# Patient Record
Sex: Male | Born: 1950 | Race: White | Hispanic: No | Marital: Single | Smoking: Never smoker
Health system: Southern US, Community
[De-identification: ages and names within clinical notes are randomized; demographics above are authoritative.]

## PROBLEM LIST (undated history)

## (undated) HISTORY — PX: APPENDECTOMY: SHX54

## (undated) HISTORY — PX: KNEE SURGERY: SHX244

---

## 2008-10-20 ENCOUNTER — Emergency Department: Payer: Self-pay | Admitting: Emergency Medicine

## 2012-04-29 ENCOUNTER — Emergency Department: Payer: Self-pay | Admitting: Emergency Medicine

## 2012-04-29 LAB — URINALYSIS, COMPLETE
Bilirubin,UR: NEGATIVE
Glucose,UR: NEGATIVE mg/dL (ref 0–75)
Nitrite: NEGATIVE
Ph: 5 (ref 4.5–8.0)
Protein: NEGATIVE
Squamous Epithelial: NONE SEEN
WBC UR: 8 /HPF (ref 0–5)

## 2012-04-29 LAB — COMPREHENSIVE METABOLIC PANEL
Albumin: 4.1 g/dL (ref 3.4–5.0)
Alkaline Phosphatase: 65 U/L (ref 50–136)
Anion Gap: 9 (ref 7–16)
Bilirubin,Total: 0.3 mg/dL (ref 0.2–1.0)
Chloride: 104 mmol/L (ref 98–107)
Creatinine: 1.31 mg/dL — ABNORMAL HIGH (ref 0.60–1.30)
Osmolality: 282 (ref 275–301)
Potassium: 4.2 mmol/L (ref 3.5–5.1)
SGOT(AST): 17 U/L (ref 15–37)
Sodium: 139 mmol/L (ref 136–145)

## 2012-04-29 LAB — CBC WITH DIFFERENTIAL/PLATELET
Basophil %: 0.2 %
Eosinophil #: 0 10*3/uL (ref 0.0–0.7)
Eosinophil %: 0 %
HCT: 39.7 % — ABNORMAL LOW (ref 40.0–52.0)
Lymphocyte %: 3.7 %
MCH: 28.6 pg (ref 26.0–34.0)
Monocyte #: 0.6 x10 3/mm (ref 0.2–1.0)
Monocyte %: 3.2 %
Neutrophil #: 17.2 10*3/uL — ABNORMAL HIGH (ref 1.4–6.5)
Neutrophil %: 92.9 %
RBC: 4.57 10*6/uL (ref 4.40–5.90)
WBC: 18.6 10*3/uL — ABNORMAL HIGH (ref 3.8–10.6)

## 2013-09-23 IMAGING — CT CT STONE STUDY
2 series · 12 of 16 positions shown, 15 images · non-contrast
Comparison: none

REASON FOR EXAM: right sided flank pain
COMMENTS:

PROCEDURE:     CT  - CT ABDOMEN /PELVIS WO (STONE)  - April 29, 2012 [DATE]
RESULT:     CT abdomen pelvis dated 04/29/2012.
TECHNIQUE: Helical noncontrasted 3 mm sections were obtained from the lung
bases through the pubic symphysis.

[Series 2: soft tissue · axial · 0.67mm/px · z∈[-938,-556]mm · 10 of 157 slices shown, 13 images]
[im 15/157  soft-tissue]
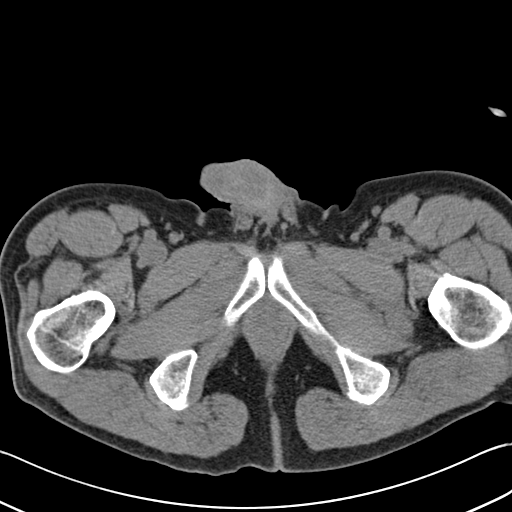
[im 15/157  bone]
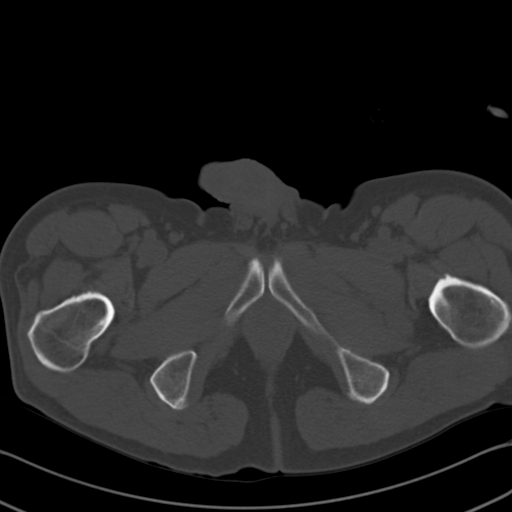
[im 29/157  soft-tissue]
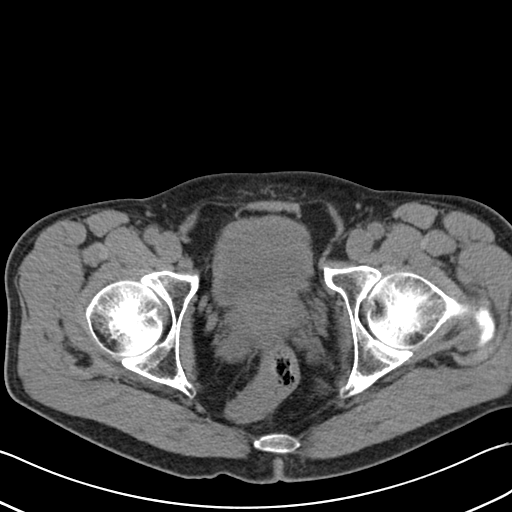
[im 43/157  soft-tissue]
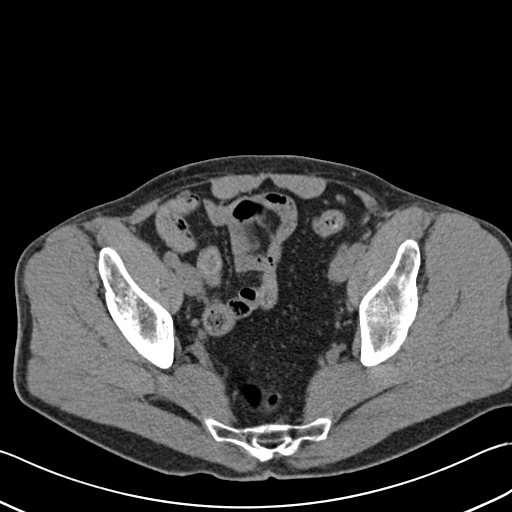
[im 57/157  soft-tissue]
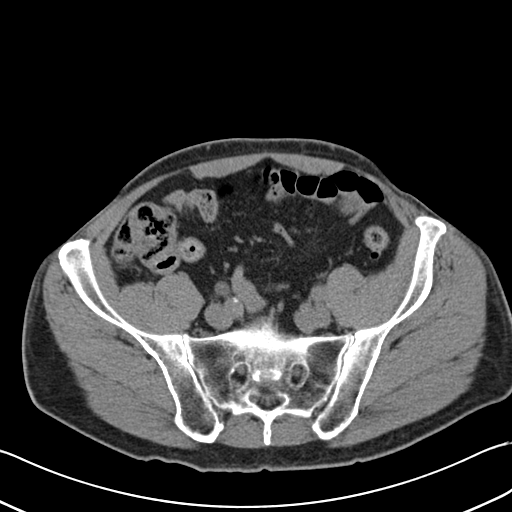
[im 71/157  soft-tissue]
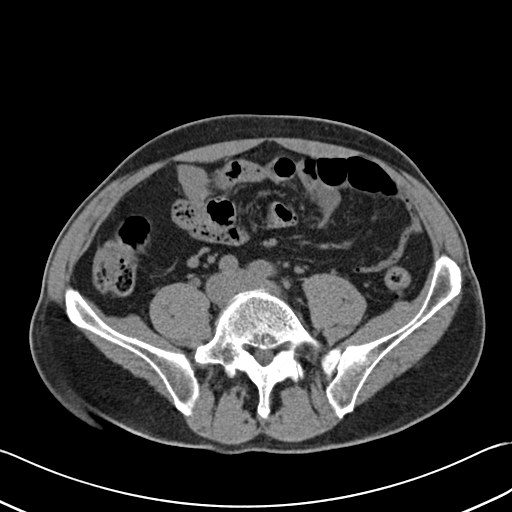
[im 71/157  bone]
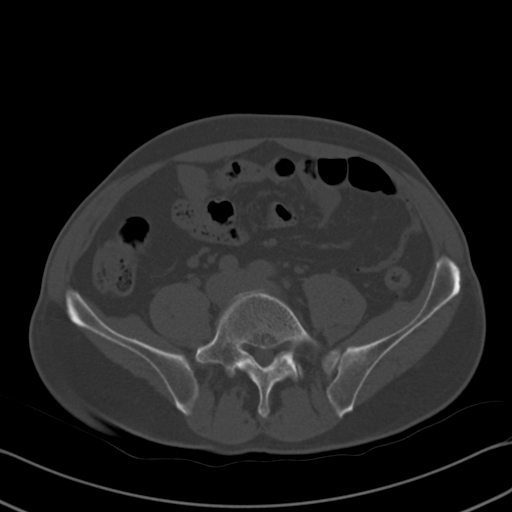
[im 86/157  soft-tissue]
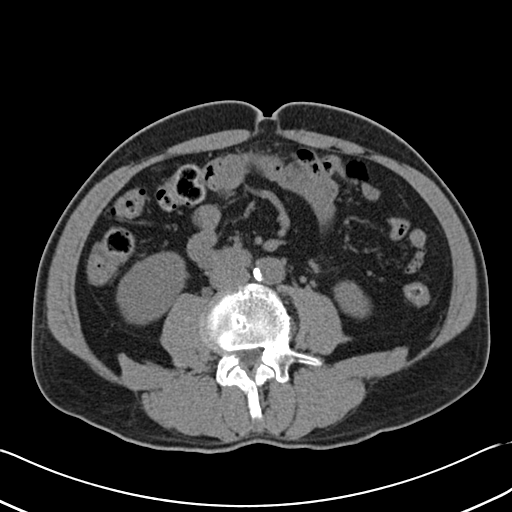
[im 100/157  soft-tissue]
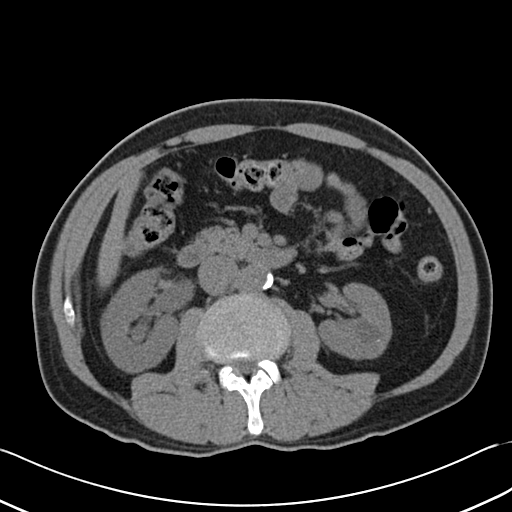
[im 114/157  soft-tissue]
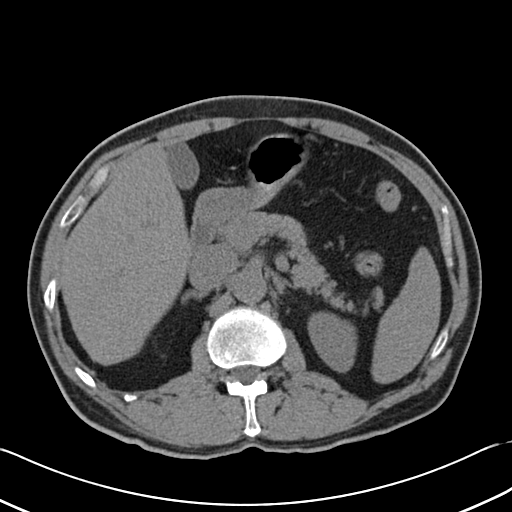
[im 128/157  soft-tissue]
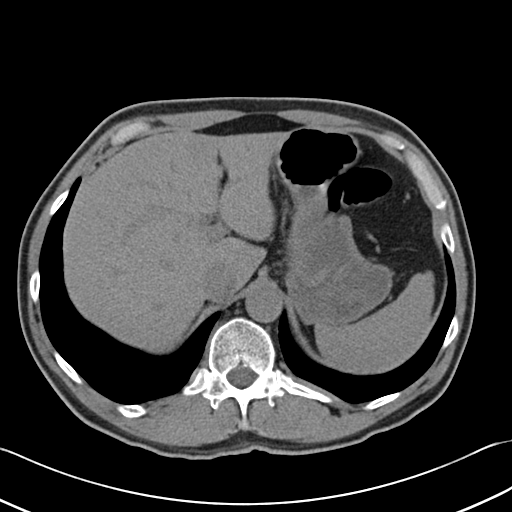
[im 128/157  bone]
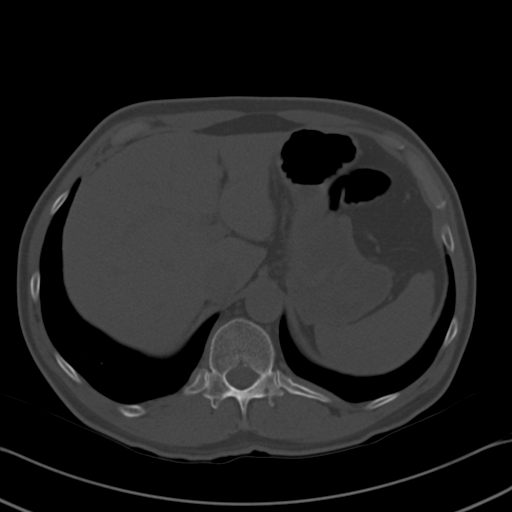
[im 142/157  soft-tissue]
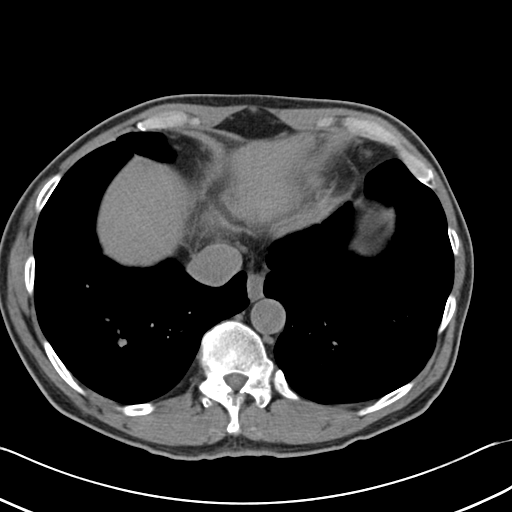

[Series 4: lung · axial · 0.67mm/px · z∈[-610,-562]mm · 2 of 50 slices shown]
[im 17/50  bone]
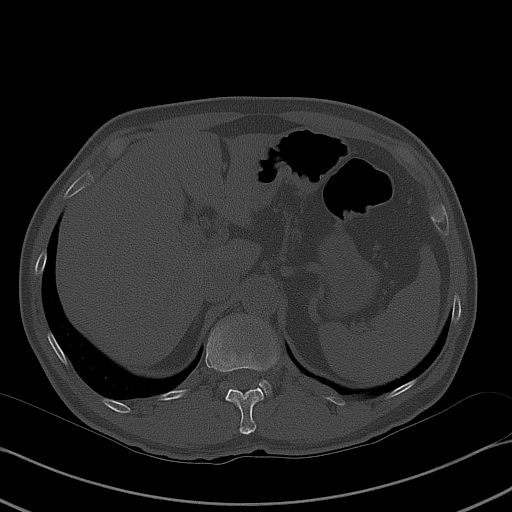
[im 33/50  bone]
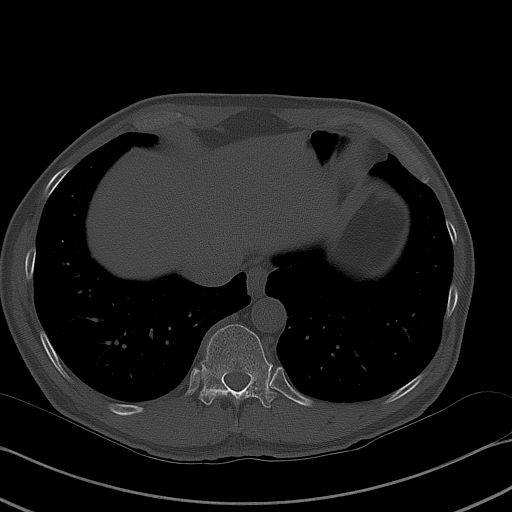

[12 of 16 positions shown; findings below may reference images not displayed]

FINDINGS: Linear area of increased density projects within the medial
segment right middle lobe. A mild area of increased density projects on the
posterior aspect of the right cardiophrenic angle.

Evaluation of the right kidney demonstrates mild hydronephrosis as well as
mild to moderate hydroureter. The ureter is dilated to level the urinary
bladder. A 3.5 mm ureterovesical calculus is identified.

The left kidney demonstrates a vague area of low attenuation within the
posterior mid pole like representing a cyst.

Noncontrast evaluation of the liver, spleen, adrenals, pancreas is
unremarkable. There is no evidence of bowel obstruction, enteritis, colitis,
diverticulitis, nor appendicitis. There is diverticulosis within the sigmoid
colon without diverticulitis.
IMPRESSION: 3.5 mm ureterovesical calculus on the right with associated
mild obstructive uropathy.
2. Atelectasis versus infiltrate within the right middle lobe. These
findings are mild.

## 2018-02-10 ENCOUNTER — Emergency Department
Admission: EM | Admit: 2018-02-10 | Discharge: 2018-02-10 | Disposition: A | Discharge status: Home / Self Care | Payer: Self-pay | Attending: Emergency Medicine | Admitting: Emergency Medicine

## 2018-02-10 ENCOUNTER — Other Ambulatory Visit: Payer: Self-pay

## 2018-02-10 ENCOUNTER — Encounter: Payer: Self-pay | Admitting: Emergency Medicine

## 2018-02-10 DIAGNOSIS — A281 Cat-scratch disease: Secondary | ICD-10-CM | POA: Insufficient documentation

## 2018-02-10 DIAGNOSIS — Z23 Encounter for immunization: Secondary | ICD-10-CM | POA: Insufficient documentation

## 2018-02-10 LAB — CBC WITH DIFFERENTIAL/PLATELET
ABS IMMATURE GRANULOCYTES: 0.08 10*3/uL — AB (ref 0.00–0.07)
BASOS PCT: 0 %
Basophils Absolute: 0 10*3/uL (ref 0.0–0.1)
Eosinophils Absolute: 0 10*3/uL (ref 0.0–0.5)
Eosinophils Relative: 0 %
HCT: 39.7 % (ref 39.0–52.0)
Hemoglobin: 12.8 g/dL — ABNORMAL LOW (ref 13.0–17.0)
Immature Granulocytes: 1 %
Lymphocytes Relative: 6 %
Lymphs Abs: 0.9 10*3/uL (ref 0.7–4.0)
MCH: 29.2 pg (ref 26.0–34.0)
MCHC: 32.2 g/dL (ref 30.0–36.0)
MCV: 90.6 fL (ref 80.0–100.0)
MONO ABS: 0.9 10*3/uL (ref 0.1–1.0)
Monocytes Relative: 6 %
NEUTROS ABS: 14 10*3/uL — AB (ref 1.7–7.7)
Neutrophils Relative %: 87 %
PLATELETS: 265 10*3/uL (ref 150–400)
RBC: 4.38 MIL/uL (ref 4.22–5.81)
RDW: 12.8 % (ref 11.5–15.5)
WBC: 15.9 10*3/uL — ABNORMAL HIGH (ref 4.0–10.5)
nRBC: 0 % (ref 0.0–0.2)

## 2018-02-10 LAB — COMPREHENSIVE METABOLIC PANEL
ALBUMIN: 4.3 g/dL (ref 3.5–5.0)
ALT: 12 U/L (ref 0–44)
AST: 19 U/L (ref 15–41)
Alkaline Phosphatase: 43 U/L (ref 38–126)
Anion gap: 8 (ref 5–15)
BUN: 18 mg/dL (ref 8–23)
CHLORIDE: 99 mmol/L (ref 98–111)
CO2: 28 mmol/L (ref 22–32)
Calcium: 9.4 mg/dL (ref 8.9–10.3)
Creatinine, Ser: 0.92 mg/dL (ref 0.61–1.24)
GFR calc Af Amer: >60 mL/min (ref >60)
GFR calc non Af Amer: >60 mL/min (ref >60)
GLUCOSE: 150 mg/dL — AB (ref 70–99)
POTASSIUM: 3.7 mmol/L (ref 3.5–5.1)
SODIUM: 135 mmol/L (ref 135–145)
TOTAL PROTEIN: 7.1 g/dL (ref 6.5–8.1)
Total Bilirubin: 0.8 mg/dL (ref 0.3–1.2)

## 2018-02-10 MED ORDER — TETANUS-DIPHTH-ACELL PERTUSSIS 5-2.5-18.5 LF-MCG/0.5 IM SUSP
0.5000 mL | Freq: Once | INTRAMUSCULAR | Status: AC
  Administered 2018-02-10: 0.5 mL via INTRAMUSCULAR
  Filled 2018-02-10: qty 0.5

## 2018-02-10 MED ORDER — CEPHALEXIN 500 MG PO CAPS: 500.0000 mg | ORAL_CAPSULE | Freq: Four times a day (QID) | ORAL | 0 refills | Status: AC

## 2018-02-10 MED ORDER — AZITHROMYCIN 250 MG PO TABS: ORAL_TABLET | ORAL | 0 refills | Status: DC

## 2018-02-10 MED ORDER — SODIUM CHLORIDE 0.9 % IV SOLN
3.0000 g | Freq: Once | INTRAVENOUS | Status: AC
  Administered 2018-02-10: 3 g via INTRAVENOUS
  Filled 2018-02-10: qty 3

## 2018-02-10 NOTE — ED Triage Notes (Signed)
Pt presents to ED via POV with c/o R hand swelling. Pt states he is the owner of the cat, cat bit R hand last night. Pt presents with significant swelling to R 1st digit and pain. Pt maintains movement of fingers at this time.

## 2018-02-10 NOTE — ED Provider Notes (Signed)
[facility] Emergency Department Provider Note  ____________________________________________  Time seen: Approximately 9:13 PM  I have reviewed the triage vital signs and the nursing notes.   HISTORY  Chief Complaint Hand Pain    HPI Dustin Casey is a 67 y.o. male presents emergency department for evaluation of right hand pain after cat scratch last night.  Cat scratch the back of the second third and fourth fingers.  Hand feels stiff.  Patient states that pain and swelling has improved today.  Cat is 78 months old and patient has had a month since he was a kitten.  Cat has not had his vaccinations yet.  Last tetanus was 20 years ago.  No fever, chills.   History reviewed. No pertinent past medical history.  There are no active problems to display for this patient.   Past Surgical History:  Procedure Laterality Date  . APPENDECTOMY      Prior to Admission medications   Medication Sig Start Date End Date Taking? Authorizing Provider  azithromycin (ZITHROMAX Z-PAK) 250 MG tablet Take 2 tablets (500 mg) on  Day 1,  followed by 1 tablet (250 mg) once daily on Days 2 through 5. 02/10/18   Enid Derry, PA-C  cephALEXin (KEFLEX) 500 MG capsule Take 1 capsule (500 mg total) by mouth 4 (four) times daily for 10 days. 02/10/18 02/20/18  Enid Derry, PA-C    Allergies Patient has no known allergies.  No family history on file.  Social History Social History   Tobacco Use  . Smoking status: Never Smoker  . Smokeless tobacco: Never Used  Substance Use Topics  . Alcohol use: Not Currently  . Drug use: Never     Review of Systems  Constitutional: No fever/chills Gastrointestinal: No nausea, no vomiting.  Musculoskeletal: Positive for hand pain. Skin: Negative for rash, ecchymosis.  Positive for scratch. Neurological: Negative for numbness or tingling   ____________________________________________   PHYSICAL EXAM:  VITAL SIGNS: ED  Triage Vitals  Enc Vitals Group     BP 02/10/18 1810 122/67     Pulse Rate 02/10/18 1810 75     Resp 02/10/18 1810 16     Temp 02/10/18 1810 98.2 F (36.8 C)     Temp Source 02/10/18 1810 Oral     SpO2 02/10/18 1810 98 %     Weight 02/10/18 1813 138 lb (62.6 kg)     Height 02/10/18 1813 5\' 8"  (1.727 m)     Head Circumference --      Peak Flow --      Pain Score 02/10/18 1813 5     Pain Loc --      Pain Edu? --      Excl. in GC? --      Constitutional: Alert and oriented. Well appearing and in no acute distress. Eyes: Conjunctivae are normal. PERRL. EOMI. Head: Atraumatic. ENT:      Ears:      Nose: No congestion/rhinnorhea.      Mouth/Throat: Mucous membranes are moist.  Neck: No stridor.  Cardiovascular: Normal rate, regular rhythm.  Good peripheral circulation. Respiratory: Normal respiratory effort without tachypnea or retractions. Lungs CTAB. Good air entry to the bases with no decreased or absent breath sounds. Musculoskeletal: Full range of motion to all extremities. No gross deformities appreciated. Neurologic:  Normal speech and language. No gross focal neurologic deficits are appreciated.  Skin:  Skin is warm, dry.  Scratch to dorsal proximal second and third fingers with surrounding erythema.  No fusiform swelling of digits.  No tenderness to palpation over flexor sheath. No wounds to volar hand. Grip strength equal.  Psychiatric: Mood and affect are normal. Speech and behavior are normal. Patient exhibits appropriate insight and judgement.   ____________________________________________   LABS (all labs ordered are listed, but only abnormal results are displayed)  Labs Reviewed  COMPREHENSIVE METABOLIC PANEL - Abnormal; Notable for the following components:      Result Value   Glucose, Bld 150 (*)    All other components within normal limits  CBC WITH DIFFERENTIAL/PLATELET - Abnormal; Notable for the following components:   WBC 15.9 (*)    Hemoglobin 12.8 (*)     Neutro Abs 14.0 (*)    Abs Immature Granulocytes 0.08 (*)    All other components within normal limits   ____________________________________________  EKG   ____________________________________________  RADIOLOGY   No results found.  ____________________________________________    PROCEDURES  Procedure(s) performed:    Procedures    Medications  Tdap (BOOSTRIX) injection 0.5 mL (0.5 mLs Intramuscular Given 02/10/18 2122)  Ampicillin-Sulbactam (UNASYN) 3 g in sodium chloride 0.9 % 100 mL IVPB (0 g Intravenous Stopped 02/10/18 2221)     ____________________________________________   INITIAL IMPRESSION / ASSESSMENT AND PLAN / ED COURSE  Pertinent labs & imaging results that were available during my care of the patient were reviewed by me and considered in my medical decision making (see chart for details).  Review of the [place] CSRS was performed in accordance of the [facility] prior to dispensing any controlled drugs.     Patient's diagnosis is consistent with cat scratch with cellulitis.Vital signs and exam are reassuring. WBC elevated at 15.9, consistent with infection. IV Unasyn was given.  Patient will be started on azythromycin to cover for Bartonella.  Police department was notified in the emergency department for follow-up of cat scratch and rabies status.  Cat is a pet of the patient's and has no symptoms of rabies.  Tetanus shot was updated.  Patient will be discharged home with prescriptions for Keflex and azithromycin. Patient is to follow up with primary care as directed. Patient is given ED precautions to return to the ED for any worsening or new symptoms.     ____________________________________________  FINAL CLINICAL IMPRESSION(S) / ED DIAGNOSES  Final diagnoses:  Cat scratch fever      NEW MEDICATIONS STARTED DURING THIS VISIT:  ED Discharge Orders         Ordered    azithromycin (ZITHROMAX Z-PAK) 250 MG tablet     02/10/18 2216     cephALEXin (KEFLEX) 500 MG capsule  4 times daily     02/10/18 2216              This chart was dictated using voice recognition software/Dragon. Despite best efforts to proofread, errors can occur which can change the meaning. Any change was purely unintentional.    Enid Derry, PA-C 02/10/18 2311    Jene Every, MD 02/11/18 [phone]

## 2018-07-26 ENCOUNTER — Ambulatory Visit: Payer: Self-pay | Admitting: Family Medicine

## 2018-08-25 ENCOUNTER — Ambulatory Visit: Payer: Self-pay | Admitting: Family Medicine

## 2018-09-06 ENCOUNTER — Ambulatory Visit: Payer: Medicare Other | Admitting: Family Medicine

## 2019-08-03 ENCOUNTER — Other Ambulatory Visit: Payer: Self-pay

## 2019-08-03 ENCOUNTER — Ambulatory Visit (INDEPENDENT_AMBULATORY_CARE_PROVIDER_SITE_OTHER): Payer: Medicare Other | Admitting: Family Medicine

## 2019-08-03 ENCOUNTER — Encounter: Payer: Self-pay | Admitting: Family Medicine

## 2019-08-03 VITALS — BP 135/80 | HR 70 | Temp 98.5°F | Ht 67.0 in | Wt 151.0 lb

## 2019-08-03 DIAGNOSIS — Z7689 Persons encountering health services in other specified circumstances: Secondary | ICD-10-CM | POA: Diagnosis not present

## 2019-08-03 DIAGNOSIS — R0982 Postnasal drip: Secondary | ICD-10-CM

## 2019-08-03 DIAGNOSIS — R41840 Attention and concentration deficit: Secondary | ICD-10-CM | POA: Insufficient documentation

## 2019-08-03 MED ORDER — ATOMOXETINE HCL 40 MG PO CAPS: 40.0000 mg | ORAL_CAPSULE | Freq: Every day | ORAL | 0 refills | Status: DC

## 2019-08-03 MED ORDER — FLUTICASONE PROPIONATE 50 MCG/ACT NA SUSP: 2.0000 | Freq: Two times a day (BID) | NASAL | 6 refills | Status: DC

## 2019-08-03 NOTE — Progress Notes (Addendum)
BP 135/80   Pulse 70   Temp 98.5 F (36.9 C) (Oral)   Ht 5\' 7"  (1.702 m)   Wt 151 lb (68.5 kg)   SpO2 95%   BMI 23.65 kg/m    Subjective:    Patient ID: Dustin Casey, male    DOB: 1950-11-21, 69 y.o.   MRN: [id number]  HPI: Dustin Casey is a 69 y.o. male  Chief Complaint  Patient presents with  . Establish Care    pt would like to discuss about snoring and memory problems   Concerned about some memory issues he's had since school age. Can't remember numbers, things he said the day before, tasks his wife has asked him to do. Also feels he is hyperactive in general, jostling legs and drumming fingers. Had a difficult time in school per patient and wife often gets frustrated with him because he's so forgetful and unfocused. Has never sought care for this in the past or been evaluated formally. Denies mood concerns, anxiousness.   Hacking cough and nasal inflammation on the right side intermittently, ongoing for many years. Has never tried anything for this.   Has no known chronic medical problems, and not had a CPE in years. Not taking any medications.   Relevant past medical, surgical, family and social history reviewed and updated as indicated. Interim medical history since our last visit reviewed. Allergies and medications reviewed and updated.  Review of Systems  Per HPI unless specifically indicated above     Objective:    BP 135/80   Pulse 70   Temp 98.5 F (36.9 C) (Oral)   Ht 5\' 7"  (1.702 m)   Wt 151 lb (68.5 kg)   SpO2 95%   BMI 23.65 kg/m   Wt Readings from Last 3 Encounters:  08/03/19 151 lb (68.5 kg)  02/10/18 138 lb 0.1 oz (62.6 kg)    Physical Exam Vitals and nursing note reviewed.  Constitutional:      Appearance: Normal appearance.  HENT:     Head: Atraumatic.     Nose:     Comments: Nasal mucosa erythematous    Mouth/Throat:     Pharynx: Posterior oropharyngeal erythema present.  Eyes:     Extraocular Movements: Extraocular  movements intact.     Conjunctiva/sclera: Conjunctivae normal.  Cardiovascular:     Rate and Rhythm: Normal rate and regular rhythm.  Pulmonary:     Effort: Pulmonary effort is normal.     Breath sounds: Normal breath sounds.  Musculoskeletal:        General: Normal range of motion.     Cervical back: Normal range of motion and neck supple.  Skin:    General: Skin is warm and dry.  Neurological:     General: No focal deficit present.     Mental Status: He is oriented to person, place, and time.  Psychiatric:        Mood and Affect: Mood normal.        Thought Content: Thought content normal.        Judgment: Judgment normal.     Comments: Speaks rapidly, sometimes a bit disorganized     Results for orders placed or performed during the hospital encounter of 02/10/18  Comprehensive metabolic panel  Result Value Ref Range   Sodium 135 135 - 145 mmol/L   Potassium 3.7 3.5 - 5.1 mmol/L   Chloride 99 98 - 111 mmol/L   CO2 28 22 - 32 mmol/L   Glucose,  Bld 150 (H) 70 - 99 mg/dL   BUN 18 8 - 23 mg/dL   Creatinine, Ser 0.92 0.61 - 1.24 mg/dL   Calcium 9.4 8.9 - 10.3 mg/dL   Total Protein 7.1 6.5 - 8.1 g/dL   Albumin 4.3 3.5 - 5.0 g/dL   AST 19 15 - 41 U/L   ALT 12 0 - 44 U/L   Alkaline Phosphatase 43 38 - 126 U/L   Total Bilirubin 0.8 0.3 - 1.2 mg/dL   GFR calc non Af Amer >60 >60 mL/min   GFR calc Af Amer >60 >60 mL/min   Anion gap 8 5 - 15  CBC with Differential  Result Value Ref Range   WBC 15.9 (H) 4.0 - 10.5 K/uL   RBC 4.38 4.22 - 5.81 MIL/uL   Hemoglobin 12.8 (L) 13.0 - 17.0 g/dL   HCT 39.7 39.0 - 52.0 %   MCV 90.6 80.0 - 100.0 fL   MCH 29.2 26.0 - 34.0 pg   MCHC 32.2 30.0 - 36.0 g/dL   RDW 12.8 11.5 - 15.5 %   Platelets 265 150 - 400 K/uL   nRBC 0.0 0.0 - 0.2 %   Neutrophils Relative % 87 %   Neutro Abs 14.0 (H) 1.7 - 7.7 K/uL   Lymphocytes Relative 6 %   Lymphs Abs 0.9 0.7 - 4.0 K/uL   Monocytes Relative 6 %   Monocytes Absolute 0.9 0.1 - 1.0 K/uL    Eosinophils Relative 0 %   Eosinophils Absolute 0.0 0.0 - 0.5 K/uL   Basophils Relative 0 %   Basophils Absolute 0.0 0.0 - 0.1 K/uL   Immature Granulocytes 1 %   Abs Immature Granulocytes 0.08 (H) 0.00 - 0.07 K/uL      Assessment & Plan:   Problem List Items Addressed This Visit      Other   Concentration deficit - Primary    Suspect based on hx he reports and my observations during interview that he has some level of ADHD. Open to trying strattera, working on strategies to get more organized and focused. Is agreeable to a future counseling evaluation if needed and no improvement on medications       Other Visit Diagnoses    Encounter to establish care       Post-nasal drip       Suspect this is causing his chronic night cough/drainage. Tx with flonase BID, may add zyrtec if needed      40 minutes spent today in direct care and counseling with patient, 10 minutes spent on documentation and review  Follow up plan: Return in about 4 weeks (around 08/31/2019) for CPE.

## 2019-08-04 NOTE — Assessment & Plan Note (Signed)
Suspect based on hx he reports and my observations during interview that he has some level of ADHD. Open to trying strattera, working on strategies to get more organized and focused. Is agreeable to a future counseling evaluation if needed and no improvement on medications

## 2019-08-14 ENCOUNTER — Other Ambulatory Visit: Payer: Self-pay

## 2019-08-14 MED ORDER — FLUTICASONE PROPIONATE 50 MCG/ACT NA SUSP: 2.0000 | Freq: Two times a day (BID) | NASAL | 6 refills | Status: DC

## 2019-08-14 NOTE — Telephone Encounter (Signed)
Please clarify Flonase directions and send updated prescription.

## 2019-08-15 ENCOUNTER — Telehealth: Payer: Self-pay

## 2019-08-15 NOTE — Telephone Encounter (Signed)
Pharmacy sent a fax regarding patient's flonase prescription. States that it was written for 2 sprays BID when it is normally dosed 2 sprays once daily. Please clarify and resend to the pharmacy if needed.

## 2019-08-16 MED ORDER — FLUTICASONE PROPIONATE 50 MCG/ACT NA SUSP: 1.0000 | Freq: Two times a day (BID) | NASAL | 6 refills | Status: DC

## 2019-08-16 NOTE — Telephone Encounter (Signed)
Re-written to state 1 spray BID

## 2019-08-31 ENCOUNTER — Encounter: Payer: Self-pay | Admitting: Family Medicine

## 2019-08-31 ENCOUNTER — Ambulatory Visit (INDEPENDENT_AMBULATORY_CARE_PROVIDER_SITE_OTHER): Payer: Medicare Other | Admitting: Family Medicine

## 2019-08-31 ENCOUNTER — Other Ambulatory Visit: Payer: Self-pay

## 2019-08-31 VITALS — BP 126/81 | HR 86 | Temp 98.4°F | Ht 67.4 in | Wt 151.5 lb

## 2019-08-31 DIAGNOSIS — R41840 Attention and concentration deficit: Secondary | ICD-10-CM | POA: Diagnosis not present

## 2019-08-31 DIAGNOSIS — Z125 Encounter for screening for malignant neoplasm of prostate: Secondary | ICD-10-CM | POA: Diagnosis not present

## 2019-08-31 DIAGNOSIS — Z1211 Encounter for screening for malignant neoplasm of colon: Secondary | ICD-10-CM | POA: Diagnosis not present

## 2019-08-31 DIAGNOSIS — Z136 Encounter for screening for cardiovascular disorders: Secondary | ICD-10-CM

## 2019-08-31 DIAGNOSIS — Z0000 Encounter for general adult medical examination without abnormal findings: Secondary | ICD-10-CM

## 2019-08-31 LAB — UA/M W/RFLX CULTURE, ROUTINE
Bilirubin, UA: NEGATIVE
Glucose, UA: NEGATIVE
Leukocytes,UA: NEGATIVE
Nitrite, UA: NEGATIVE
Protein,UA: NEGATIVE
RBC, UA: NEGATIVE
Specific Gravity, UA: 1.02 (ref 1.005–1.030)
Urobilinogen, Ur: 0.2 mg/dL (ref 0.2–1.0)
pH, UA: 5.5 (ref 5.0–7.5)

## 2019-08-31 MED ORDER — AMPHETAMINE-DEXTROAMPHET ER 10 MG PO CP24: 10.0000 mg | ORAL_CAPSULE | Freq: Every day | ORAL | 0 refills | Status: DC

## 2019-08-31 NOTE — Progress Notes (Signed)
BP 126/81   Pulse 86   Temp 98.4 F (36.9 C)   Ht 5' 7.4" (1.712 m)   Wt 151 lb 8 oz (68.7 kg)   SpO2 92%   BMI 23.45 kg/m    Subjective:    Patient ID: Dustin Casey, male    DOB: [birth year], 69 y.o.   MRN: [id number]  HPI: Dustin Casey is a 69 y.o. male presenting on 08/31/2019 for comprehensive medical examination. Current medical complaints include:see below  Unable to pick up strattera due to cost. Continues to have persistent scattered thoughts, difficulty completing tasks at home or focusing on the details to do things accurately which has caused issues in his marriage. This is not new, this has been ongoing since childhood. Has never been evaluated prior to now per patient. Denies head injuries, headaches, speech concerns, dysphagia, extremity weakness or numbness.   He currently lives with: Interim Problems from his last visit: yes  Depression Screen done today and results listed below:  Depression screen [facility] 2/9 08/03/2019  Decreased Interest 0  Down, Depressed, Hopeless 0  PHQ - 2 Score 0  Altered sleeping 0  Tired, decreased energy 0  Change in appetite 0  Feeling bad or failure about yourself  0  Trouble concentrating 0  Moving slowly or fidgety/restless 0  Suicidal thoughts 0  PHQ-9 Score 0    The patient does not have a history of falls. I did complete a risk assessment for falls. A plan of care for falls was documented.   Past Medical History:  History reviewed. No pertinent past medical history.  Surgical History:  Past Surgical History:  Procedure Laterality Date  . APPENDECTOMY    . KNEE SURGERY      Medications:  Current Outpatient Medications on File Prior to Visit  Medication Sig  . fluticasone (FLONASE) 50 MCG/ACT nasal spray Place 1 spray into both nostrils in the morning and at bedtime. (Patient not taking: Reported on 08/31/2019)   No current facility-administered medications on file prior to visit.    Allergies:  No Known  Allergies  Social History:  Social History   Socioeconomic History  . Marital status: Single    Spouse name: Not on file  . Number of children: Not on file  . Years of education: Not on file  . Highest education level: Not on file  Occupational History  . Not on file  Tobacco Use  . Smoking status: Never Smoker  . Smokeless tobacco: Never Used  Substance and Sexual Activity  . Alcohol use: Yes    Comment: sociallly  . Drug use: Never  . Sexual activity: Yes  Other Topics Concern  . Not on file  Social History Narrative  . Not on file   Social Determinants of Health   Financial Resource Strain:   . Difficulty of Paying Living Expenses:   Food Insecurity:   . Worried About Programme researcher, broadcasting/film/video in the Last Year:   . Barista in the Last Year:   Transportation Needs:   . Freight forwarder (Medical):   Marland Kitchen Lack of Transportation (Non-Medical):   Physical Activity:   . Days of Exercise per Week:   . Minutes of Exercise per Session:   Stress:   . Feeling of Stress :   Social Connections:   . Frequency of Communication with Friends and Family:   . Frequency of Social Gatherings with Friends and Family:   . Attends Religious Services:   .  Active Member of Clubs or Organizations:   . Attends Banker Meetings:   Marland Kitchen Marital Status:   Intimate Partner Violence:   . Fear of Current or Ex-Partner:   . Emotionally Abused:   Marland Kitchen Physically Abused:   . Sexually Abused:    Social History   Tobacco Use  Smoking Status Never Smoker  Smokeless Tobacco Never Used   Social History   Substance and Sexual Activity  Alcohol Use Yes   Comment: sociallly    Family History:  History reviewed. No pertinent family history.  Past medical history, surgical history, medications, allergies, family history and social history reviewed with patient today and changes made to appropriate areas of the chart.   Review of Systems - General ROS: negative Psychological  ROS: positive for - concentration difficulties Ophthalmic ROS: negative ENT ROS: negative Allergy and Immunology ROS: negative Hematological and Lymphatic ROS: negative Endocrine ROS: negative Breast ROS: negative for breast lumps Respiratory ROS: no cough, shortness of breath, or wheezing Cardiovascular ROS: no chest pain or dyspnea on exertion Gastrointestinal ROS: no abdominal pain, change in bowel habits, or black or bloody stools Genito-Urinary ROS: no dysuria, trouble voiding, or hematuria Musculoskeletal ROS: negative Neurological ROS: no TIA or stroke symptoms Dermatological ROS: negative All other ROS negative except what is listed above and in the HPI.      Objective:    BP 126/81   Pulse 86   Temp 98.4 F (36.9 C)   Ht 5' 7.4" (1.712 m)   Wt 151 lb 8 oz (68.7 kg)   SpO2 92%   BMI 23.45 kg/m   Wt Readings from Last 3 Encounters:  08/31/19 151 lb 8 oz (68.7 kg)  08/03/19 151 lb (68.5 kg)  02/10/18 138 lb 0.1 oz (62.6 kg)    Physical Exam Vitals and nursing note reviewed.  Constitutional:      General: He is not in acute distress.    Appearance: He is well-developed.  HENT:     Head: Atraumatic.     Right Ear: External ear normal.     Left Ear: External ear normal.     Nose: Nose normal.  Eyes:     General: No scleral icterus.    Conjunctiva/sclera: Conjunctivae normal.     Pupils: Pupils are equal, round, and reactive to light.  Cardiovascular:     Rate and Rhythm: Normal rate and regular rhythm.     Heart sounds: Normal heart sounds. No murmur.  Pulmonary:     Effort: Pulmonary effort is normal. No respiratory distress.     Breath sounds: Normal breath sounds.  Abdominal:     General: Bowel sounds are normal. There is no distension.     Palpations: Abdomen is soft. There is no mass.     Tenderness: There is no abdominal tenderness. There is no guarding.  Genitourinary:    Comments: GU exam declined Musculoskeletal:        General: No tenderness.  Normal range of motion.     Cervical back: Normal range of motion and neck supple.  Skin:    General: Skin is warm and dry.     Findings: No rash.  Neurological:     Mental Status: He is alert and oriented to person, place, and time.     Deep Tendon Reflexes: Reflexes are normal and symmetric.  Psychiatric:        Behavior: Behavior normal.     Results for orders placed or performed in visit on  08/31/19  CBC with Differential/Platelet  Result Value Ref Range   WBC 8.2 3.4 - 10.8 x10E3/uL   RBC 4.99 4.14 - 5.80 x10E6/uL   Hemoglobin 15.1 13.0 - 17.7 g/dL   Hematocrit 45.1 37.5 - 51.0 %   MCV 90 79 - 97 fL   MCH 30.3 26.6 - 33.0 pg   MCHC 33.5 31.5 - 35.7 g/dL   RDW 12.2 11.6 - 15.4 %   Platelets 263 150 - 450 x10E3/uL   Neutrophils 74 Not Estab. %   Lymphs 18 Not Estab. %   Monocytes 7 Not Estab. %   Eos 0 Not Estab. %   Basos 1 Not Estab. %   Neutrophils Absolute 6.0 1.4 - 7.0 x10E3/uL   Lymphocytes Absolute 1.5 0.7 - 3.1 x10E3/uL   Monocytes Absolute 0.5 0.1 - 0.9 x10E3/uL   EOS (ABSOLUTE) 0.0 0.0 - 0.4 x10E3/uL   Basophils Absolute 0.1 0.0 - 0.2 x10E3/uL   Immature Granulocytes 0 Not Estab. %   Immature Grans (Abs) 0.0 0.0 - 0.1 x10E3/uL  Comprehensive metabolic panel  Result Value Ref Range   Glucose 106 (H) 65 - 99 mg/dL   BUN 15 8 - 27 mg/dL   Creatinine, Ser 1.10 0.76 - 1.27 mg/dL   GFR calc non Af Amer 70 >59 mL/min/1.73   GFR calc Af Amer 80 >59 mL/min/1.73   BUN/Creatinine Ratio 14 10 - 24   Sodium 139 134 - 144 mmol/L   Potassium 4.2 3.5 - 5.2 mmol/L   Chloride 102 96 - 106 mmol/L   CO2 25 20 - 29 mmol/L   Calcium 9.8 8.6 - 10.2 mg/dL   Total Protein 7.1 6.0 - 8.5 g/dL   Albumin 4.5 3.8 - 4.8 g/dL   Globulin, Total 2.6 1.5 - 4.5 g/dL   Albumin/Globulin Ratio 1.7 1.2 - 2.2   Bilirubin Total 0.2 0.0 - 1.2 mg/dL   Alkaline Phosphatase 56 39 - 117 IU/L   AST 15 0 - 40 IU/L   ALT 13 0 - 44 IU/L  Lipid Panel w/o Chol/HDL Ratio  Result Value Ref Range    Cholesterol, Total 207 (H) 100 - 199 mg/dL   Triglycerides 271 (H) 0 - 149 mg/dL   HDL 47 >39 mg/dL   VLDL Cholesterol Cal 47 (H) 5 - 40 mg/dL   LDL Chol Calc (NIH) 113 (H) 0 - 99 mg/dL  TSH  Result Value Ref Range   TSH 1.790 0.450 - 4.500 uIU/mL  UA/M w/rflx Culture, Routine   Specimen: Urine   URINE  Result Value Ref Range   Specific Gravity, UA 1.020 1.005 - 1.030   pH, UA 5.5 5.0 - 7.5   Color, UA Yellow Yellow   Appearance Ur Clear Clear   Leukocytes,UA Negative Negative   Protein,UA Negative Negative/Trace   Glucose, UA Negative Negative   Ketones, UA Trace (A) Negative   RBC, UA Negative Negative   Bilirubin, UA Negative Negative   Urobilinogen, Ur 0.2 0.2 - 1.0 mg/dL   Nitrite, UA Negative Negative  PSA  Result Value Ref Range   Prostate Specific Ag, Serum 3.5 0.0 - 4.0 ng/mL      Assessment & Plan:   Problem List Items Addressed This Visit      Other   Concentration deficit - Primary    Could not afford strattera, will trial low dose adderall xr and see if this helps keep his thoughts organized and improves his ongoing sxs. Discussed behavioral modifications for improvement additionally  Other Visit Diagnoses    Annual physical exam       Relevant Orders   CBC with Differential/Platelet (Completed)   Comprehensive metabolic panel (Completed)   TSH (Completed)   UA/M w/rflx Culture, Routine (Completed)   Screening for cardiovascular condition       Relevant Orders   Lipid Panel w/o Chol/HDL Ratio (Completed)   Screening for colon cancer       Relevant Orders   Cologuard   Screening for prostate cancer       Relevant Orders   PSA (Completed)       Discussed aspirin prophylaxis for myocardial infarction prevention and decision was it was not indicated  LABORATORY TESTING:  Health maintenance labs ordered today as discussed above.   The natural history of prostate cancer and ongoing controversy regarding screening and potential treatment  outcomes of prostate cancer has been discussed with the patient. The meaning of a false positive PSA and a false negative PSA has been discussed. He indicates understanding of the limitations of this screening test and wishes to proceed with screening PSA testing.   IMMUNIZATIONS:   - Tdap: Tetanus vaccination status reviewed: last tetanus booster within 10 years. - Influenza: Postponed to flu season - Pneumovax: Refused - Prevnar: Refused - HPV: Not applicable - Zostavax vaccine: Refused  SCREENING: - Colonoscopy: cologuard ordered  Discussed with patient purpose of the colonoscopy is to detect colon cancer at curable precancerous or early stages     PATIENT COUNSELING:    Sexuality: Discussed sexually transmitted diseases, partner selection, use of condoms, avoidance of unintended pregnancy  and contraceptive alternatives.   Advised to avoid cigarette smoking.  I discussed with the patient that most people either abstain from alcohol or drink within safe limits (<=14/week and <=4 drinks/occasion for males, <=7/weeks and <= 3 drinks/occasion for females) and that the risk for alcohol disorders and other health effects rises proportionally with the number of drinks per week and how often a drinker exceeds daily limits.  Discussed cessation/primary prevention of drug use and availability of treatment for abuse.   Diet: Encouraged to adjust caloric intake to maintain  or achieve ideal body weight, to reduce intake of dietary saturated fat and total fat, to limit sodium intake by avoiding high sodium foods and not adding table salt, and to maintain adequate dietary potassium and calcium preferably from fresh fruits, vegetables, and low-fat dairy products.    stressed the importance of regular exercise  Injury prevention: Discussed safety belts, safety helmets, smoke detector, smoking near bedding or upholstery.   Dental health: Discussed importance of regular tooth brushing, flossing,  and dental visits.   Follow up plan: NEXT PREVENTATIVE PHYSICAL DUE IN 1 YEAR. Return in about 4 weeks (around 09/28/2019) for concentration f/u.

## 2019-09-01 LAB — COMPREHENSIVE METABOLIC PANEL
ALT: 13 IU/L (ref 0–44)
AST: 15 IU/L (ref 0–40)
Albumin/Globulin Ratio: 1.7 (ref 1.2–2.2)
Albumin: 4.5 g/dL (ref 3.8–4.8)
Alkaline Phosphatase: 56 IU/L (ref 39–117)
BUN/Creatinine Ratio: 14 (ref 10–24)
BUN: 15 mg/dL (ref 8–27)
Bilirubin Total: 0.2 mg/dL (ref 0.0–1.2)
CO2: 25 mmol/L (ref 20–29)
Calcium: 9.8 mg/dL (ref 8.6–10.2)
Chloride: 102 mmol/L (ref 96–106)
Creatinine, Ser: 1.1 mg/dL (ref 0.76–1.27)
GFR calc Af Amer: 80 mL/min/{1.73_m2} (ref >59)
GFR calc non Af Amer: 70 mL/min/{1.73_m2} (ref >59)
Globulin, Total: 2.6 g/dL (ref 1.5–4.5)
Glucose: 106 mg/dL — ABNORMAL HIGH (ref 65–99)
Potassium: 4.2 mmol/L (ref 3.5–5.2)
Sodium: 139 mmol/L (ref 134–144)
Total Protein: 7.1 g/dL (ref 6.0–8.5)

## 2019-09-01 LAB — CBC WITH DIFFERENTIAL/PLATELET
Basophils Absolute: 0.1 10*3/uL (ref 0.0–0.2)
Basos: 1 %
EOS (ABSOLUTE): 0 10*3/uL (ref 0.0–0.4)
Eos: 0 %
Hematocrit: 45.1 % (ref 37.5–51.0)
Hemoglobin: 15.1 g/dL (ref 13.0–17.7)
Immature Grans (Abs): 0 10*3/uL (ref 0.0–0.1)
Immature Granulocytes: 0 %
Lymphocytes Absolute: 1.5 10*3/uL (ref 0.7–3.1)
Lymphs: 18 %
MCH: 30.3 pg (ref 26.6–33.0)
MCHC: 33.5 g/dL (ref 31.5–35.7)
MCV: 90 fL (ref 79–97)
Monocytes Absolute: 0.5 10*3/uL (ref 0.1–0.9)
Monocytes: 7 %
Neutrophils Absolute: 6 10*3/uL (ref 1.4–7.0)
Neutrophils: 74 %
Platelets: 263 10*3/uL (ref 150–450)
RBC: 4.99 x10E6/uL (ref 4.14–5.80)
RDW: 12.2 % (ref 11.6–15.4)
WBC: 8.2 10*3/uL (ref 3.4–10.8)

## 2019-09-01 LAB — LIPID PANEL W/O CHOL/HDL RATIO
Cholesterol, Total: 207 mg/dL — ABNORMAL HIGH (ref 100–199)
HDL: 47 mg/dL (ref >39)
LDL Chol Calc (NIH): 113 mg/dL — ABNORMAL HIGH (ref 0–99)
Triglycerides: 271 mg/dL — ABNORMAL HIGH (ref 0–149)
VLDL Cholesterol Cal: 47 mg/dL — ABNORMAL HIGH (ref 5–40)

## 2019-09-01 LAB — TSH: TSH: 1.79 u[IU]/mL (ref 0.450–4.500)

## 2019-09-01 LAB — PSA: Prostate Specific Ag, Serum: 3.5 ng/mL (ref 0.0–4.0)

## 2019-09-04 NOTE — Assessment & Plan Note (Signed)
Could not afford strattera, will trial low dose adderall xr and see if this helps keep his thoughts organized and improves his ongoing sxs. Discussed behavioral modifications for improvement additionally

## 2019-09-07 ENCOUNTER — Encounter: Payer: Self-pay | Admitting: Family Medicine

## 2019-09-07 ENCOUNTER — Telehealth: Payer: Self-pay | Admitting: Family Medicine

## 2019-09-07 DIAGNOSIS — R7301 Impaired fasting glucose: Secondary | ICD-10-CM | POA: Insufficient documentation

## 2019-09-07 NOTE — Telephone Encounter (Signed)
Copied from CRM [id number]. Topic: General - Inquiry >> Sep 07, 2019  2:25 PM Dawna Part D wrote: Reason for CRM: PT would like a hard  copy of his blood work mailed to home address, home address has been verified. Pt does not want to get setup for Mychart

## 2019-09-07 NOTE — Telephone Encounter (Signed)
Letter mailed

## 2019-09-27 LAB — HGB A1C W/O EAG: Hgb A1c MFr Bld: 5.8 % — ABNORMAL HIGH (ref 4.8–5.6)

## 2019-09-27 LAB — SPECIMEN STATUS REPORT

## 2019-09-29 ENCOUNTER — Ambulatory Visit (INDEPENDENT_AMBULATORY_CARE_PROVIDER_SITE_OTHER): Payer: Medicare Other | Admitting: Unknown Physician Specialty

## 2019-09-29 ENCOUNTER — Other Ambulatory Visit: Payer: Self-pay

## 2019-09-29 ENCOUNTER — Encounter: Payer: Self-pay | Admitting: Unknown Physician Specialty

## 2019-09-29 VITALS — BP 114/58 | HR 68 | Temp 98.9°F | Wt 149.6 lb

## 2019-09-29 DIAGNOSIS — R413 Other amnesia: Secondary | ICD-10-CM | POA: Diagnosis not present

## 2019-09-29 DIAGNOSIS — R41840 Attention and concentration deficit: Secondary | ICD-10-CM

## 2019-09-29 DIAGNOSIS — Z79899 Other long term (current) drug therapy: Secondary | ICD-10-CM | POA: Diagnosis not present

## 2019-09-29 NOTE — Assessment & Plan Note (Addendum)
Long discussion that his wife is concerned with his memory.  He has been married 3 years and she doesn't have a long-term perspective.  He says he has been this way all his life.  MME is 22/30 but I suspect baseline.  Discussed risks and benefits of meidications and agreed they aren't warranted at this time.  Consider Neurology referral if desired by him or his wife or decrease from this baseline

## 2019-09-29 NOTE — Progress Notes (Signed)
BP (!) 114/58   Pulse 68   Temp 98.9 F (37.2 C) (Oral)   Wt 149 lb 9.6 oz (67.9 kg)   SpO2 97%   BMI 23.15 kg/m    Subjective:    Patient ID: Dustin Casey, male    DOB: [birth year], 69 y.o.   MRN: [id number]  HPI: Dustin Casey is a 69 y.o. male  Chief Complaint  Patient presents with  . Concentration    4 week f/up- did not get RX for adderall   Pt states he never picked up the Adderall XR due to mix up with the pharmacy.  Pt states this is ongoing for a long time.  He has always had trouble focusing for years, even in school.  States his mind "goes off" frequently.  See last note.  Strattera was too expensive and never was able to fill Adderall.    Discussed why he is trying to get help now for this problem.  He states as his wife has asked hem to get checked out for his memory.  He states his memory issues are  not new.  He has been married for 3-4 years and she hasn't been able to observe long term changes.  Depression screen [facility] 2/9 08/03/2019  Decreased Interest 0  Down, Depressed, Hopeless 0  PHQ - 2 Score 0  Altered sleeping 0  Tired, decreased energy 0  Change in appetite 0  Feeling bad or failure about yourself  0  Trouble concentrating 0  Moving slowly or fidgety/restless 0  Suicidal thoughts 0  PHQ-9 Score 0     Social History   Socioeconomic History  . Marital status: Single    Spouse name: Not on file  . Number of children: Not on file  . Years of education: Not on file  . Highest education level: Not on file  Occupational History  . Not on file  Tobacco Use  . Smoking status: Never Smoker  . Smokeless tobacco: Never Used  Vaping Use  . Vaping Use: Never used  Substance and Sexual Activity  . Alcohol use: Yes    Comment: sociallly  . Drug use: Never  . Sexual activity: Yes  Other Topics Concern  . Not on file  Social History Narrative  . Not on file   Social Determinants of Health   Financial Resource Strain:   . Difficulty of  Paying Living Expenses:   Food Insecurity:   . Worried About Programme researcher, broadcasting/film/video in the Last Year:   . Barista in the Last Year:   Transportation Needs:   . Freight forwarder (Medical):   Marland Kitchen Lack of Transportation (Non-Medical):   Physical Activity:   . Days of Exercise per Week:   . Minutes of Exercise per Session:   Stress:   . Feeling of Stress :   Social Connections:   . Frequency of Communication with Friends and Family:   . Frequency of Social Gatherings with Friends and Family:   . Attends Religious Services:   . Active Member of Clubs or Organizations:   . Attends Banker Meetings:   Marland Kitchen Marital Status:   Intimate Partner Violence:   . Fear of Current or Ex-Partner:   . Emotionally Abused:   Marland Kitchen Physically Abused:   . Sexually Abused:     Relevant past medical, surgical, family and social history reviewed and updated as indicated. Interim medical history since our last visit reviewed. Allergies and  medications reviewed and updated.  Review of Systems  Per HPI unless specifically indicated above     Objective:    BP (!) 114/58   Pulse 68   Temp 98.9 F (37.2 C) (Oral)   Wt 149 lb 9.6 oz (67.9 kg)   SpO2 97%   BMI 23.15 kg/m   Wt Readings from Last 3 Encounters:  09/29/19 149 lb 9.6 oz (67.9 kg)  08/31/19 151 lb 8 oz (68.7 kg)  08/03/19 151 lb (68.5 kg)    Physical Exam Constitutional:      General: He is not in acute distress.    Appearance: Normal appearance. He is well-developed.  HENT:     Head: Normocephalic and atraumatic.  Eyes:     General: Lids are normal. No scleral icterus.       Right eye: No discharge.        Left eye: No discharge.     Conjunctiva/sclera: Conjunctivae normal.  Neck:     Vascular: No carotid bruit or JVD.  Cardiovascular:     Rate and Rhythm: Normal rate and regular rhythm.     Heart sounds: Normal heart sounds.  Pulmonary:     Effort: Pulmonary effort is normal. No respiratory distress.      Breath sounds: Normal breath sounds.  Abdominal:     Palpations: There is no hepatomegaly or splenomegaly.  Musculoskeletal:        General: Normal range of motion.     Cervical back: Normal range of motion and neck supple.  Skin:    General: Skin is warm and dry.     Coloration: Skin is not pale.     Findings: No rash.  Neurological:     Mental Status: He is alert and oriented to person, place, and time.  Psychiatric:        Behavior: Behavior normal.        Thought Content: Thought content normal.        Judgment: Judgment normal.    EKG RRR.  No STTW changes.  Bradycardia of 58  Mini mental is 22/30    Assessment & Plan:   Problem List Items Addressed This Visit      Unprioritized   Concentration deficit - Primary    Long discussion that his wife is concerned with his memory.  He has been married 3 years and she doesn't have a long-term perspective.  He says he has been this way all his life.  MME is 22/30 but I suspect baseline.  Discussed risks and benefits of meidications and agreed they aren't warranted at this time.  Consider Neurology referral if desired by him or his wife or decrease from this baseline      Memory deficit    See above.  MME 22/30 but seems baseline for him.  HS graduate but not able to read       Other Visit Diagnoses    New medication added       EKG done to assess the safety of adding a stimulant.  Decided not to add medication after discussing risk/benefits   Relevant Orders   EKG 12-Lead (Completed)       Follow up plan: Return if symptoms worsen or fail to improve.

## 2019-09-29 NOTE — Assessment & Plan Note (Signed)
See above.  MME 22/30 but seems baseline for him.  HS graduate but not able to read

## 2019-10-12 ENCOUNTER — Telehealth: Payer: Self-pay | Admitting: Family Medicine

## 2019-10-12 NOTE — Telephone Encounter (Signed)
Copied from CRM [id number]. Topic: Medicare AWV >> Oct 12, 2019  1:18 PM Claudette Laws R wrote: Reason for CRM: Left message for patient to call back and schedule Medicare Annual Wellness Visit (AWV) to be done virtually.  No hx of AWV; please schedule at anytime with [facility] Health Advisor.      45 Minutes appointment

## 2019-11-08 ENCOUNTER — Telehealth: Payer: Self-pay | Admitting: Family Medicine

## 2019-11-08 NOTE — Telephone Encounter (Signed)
Copied from CRM [id number]. Topic: Medicare AWV >> Nov 08, 2019 11:09 AM Claudette Laws R wrote: Reason for CRM: Left message for patient to call back and schedule Medicare Annual Wellness Visit (AWV) to be done virtually.  No hx of AWV   available as of 03/21/2019  Please schedule at anytime with [facility] Health Advisor.      45 Minutes appointment

## 2020-01-18 ENCOUNTER — Telehealth: Payer: Self-pay | Admitting: Family Medicine

## 2020-01-18 NOTE — Telephone Encounter (Signed)
Copied from CRM [id number]. Topic: Medicare AWV >> Jan 18, 2020  2:00 PM Claudette Laws R wrote: Reason for CRM:  Left message for patient to call back and schedule Medicare Annual Wellness Visit (AWV) to be done virtually.  No hx of AWV  Please schedule at anytime with [facility] Health Advisor.      45 Minutes appointment   Any questions, please call me at [phone]

## 2020-04-01 ENCOUNTER — Encounter: Payer: Self-pay | Admitting: Unknown Physician Specialty

## 2020-04-01 ENCOUNTER — Other Ambulatory Visit: Payer: Self-pay

## 2020-04-01 ENCOUNTER — Ambulatory Visit (INDEPENDENT_AMBULATORY_CARE_PROVIDER_SITE_OTHER): Payer: Medicare Other | Admitting: Unknown Physician Specialty

## 2020-04-01 DIAGNOSIS — R413 Other amnesia: Secondary | ICD-10-CM

## 2020-04-01 DIAGNOSIS — J309 Allergic rhinitis, unspecified: Secondary | ICD-10-CM | POA: Insufficient documentation

## 2020-04-01 DIAGNOSIS — H9190 Unspecified hearing loss, unspecified ear: Secondary | ICD-10-CM | POA: Insufficient documentation

## 2020-04-01 DIAGNOSIS — J3089 Other allergic rhinitis: Secondary | ICD-10-CM

## 2020-04-01 DIAGNOSIS — H9113 Presbycusis, bilateral: Secondary | ICD-10-CM | POA: Diagnosis not present

## 2020-04-01 NOTE — Progress Notes (Signed)
BP 131/86   Pulse 82   Temp 98.5 F (36.9 C)   Wt 158 lb 6.4 oz (71.8 kg)   SpO2 96%   BMI 24.51 kg/m    Subjective:    Patient ID: Dustin Casey, male    DOB: [birth year], 69 y.o.   MRN: [id number]  HPI: Dustin Casey is a 69 y.o. male  Chief Complaint  Patient presents with  . Hearing Problem  . Cough    Pt states at night time he coughs a lot says it is a dry cough has been going on for months  . Memory Loss    Pt states wife tells him his memory is not good    Memory Wife is noting that his memory is not good.  See previous notes about his concentration.  Pt states that this  Is the way he has been all his life.  Marreid 3 years and she says it is getting worse. Mini mental recently done    MMSE - Mini Mental State Exam 09/29/2019  Orientation to time 5  Orientation to Place 5  Registration 3  Attention/ Calculation 0  Recall 2  Language- name 2 objects 2  Language- repeat 1  Language- follow 3 step command 3  Language- read & follow direction 1  Write a sentence 0  Copy design 0  Total score 22    Cough Dry cough at night.  States it's due to drainage in his throat and nose stops up at night  Was given flonase and that didn't work.  He doesn't take anything else.  He never smoked. Increased drainage with cold weather  Hearing Wife feels he doesn't hear well.  He talks loudly.    Relevant past medical, surgical, family and social history reviewed and updated as indicated. Interim medical history since our last visit reviewed. Allergies and medications reviewed and updated.  Review of Systems  Per HPI unless specifically indicated above     Objective:    BP 131/86   Pulse 82   Temp 98.5 F (36.9 C)   Wt 158 lb 6.4 oz (71.8 kg)   SpO2 96%   BMI 24.51 kg/m   Wt Readings from Last 3 Encounters:  04/01/20 158 lb 6.4 oz (71.8 kg)  09/29/19 149 lb 9.6 oz (67.9 kg)  08/31/19 151 lb 8 oz (68.7 kg)    Physical Exam Constitutional:       General: He is not in acute distress.    Appearance: Normal appearance. He is well-developed and well-nourished.  HENT:     Head: Normocephalic and atraumatic.  Eyes:     General: Lids are normal. No scleral icterus.       Right eye: No discharge.        Left eye: No discharge.     Conjunctiva/sclera: Conjunctivae normal.  Neck:     Vascular: No carotid bruit or JVD.  Cardiovascular:     Rate and Rhythm: Normal rate and regular rhythm.     Heart sounds: Normal heart sounds.  Pulmonary:     Effort: Pulmonary effort is normal. No respiratory distress.     Breath sounds: Normal breath sounds.  Abdominal:     Palpations: There is no hepatomegaly or splenomegaly.  Musculoskeletal:        General: Normal range of motion.     Cervical back: Normal range of motion and neck supple.  Skin:    General: Skin is warm, dry and intact.  Coloration: Skin is not pale.     Findings: No rash.  Neurological:     Mental Status: He is alert and oriented to person, place, and time.  Psychiatric:        Mood and Affect: Mood and affect normal.        Behavior: Behavior normal.        Thought Content: Thought content normal.        Judgment: Judgment normal.     Hearing Screening   125Hz  250Hz  500Hz  1000Hz  2000Hz  3000Hz  4000Hz  6000Hz  8000Hz   Right ear:   Pass Pass Pass  Fail    Left ear:   Pass Pass Pass  Fail         Assessment & Plan:   Problem List Items Addressed This Visit      Unprioritized   Allergic rhinitis    Flonase not helpful.  Written recommendations to take Zyrtec in the evening.        Hearing loss    Hearing tests showing age related deficits.  Discussed seeing an audiologist.  Pt will think about it      Memory deficit    Will refer to neurology for evaluation.        Relevant Orders   Ambulatory referral to Neurology       Follow up plan: Return if symptoms worsen or fail to improve.

## 2020-04-01 NOTE — Patient Instructions (Signed)
Take Zyrtec nightly.  Generic is OK and it's called Ceterizine

## 2020-04-01 NOTE — Assessment & Plan Note (Signed)
Will refer to neurology for evaluation.

## 2020-04-01 NOTE — Assessment & Plan Note (Signed)
Flonase not helpful.  Written recommendations to take Zyrtec in the evening.

## 2020-04-01 NOTE — Assessment & Plan Note (Signed)
Hearing tests showing age related deficits.  Discussed seeing an audiologist.  Pt will think about it

## 2021-05-27 ENCOUNTER — Other Ambulatory Visit: Payer: Self-pay

## 2021-05-27 ENCOUNTER — Encounter: Payer: Self-pay | Admitting: Internal Medicine

## 2021-05-27 ENCOUNTER — Ambulatory Visit (INDEPENDENT_AMBULATORY_CARE_PROVIDER_SITE_OTHER): Payer: Medicare Other | Admitting: Internal Medicine

## 2021-05-27 VITALS — BP 133/85 | HR 64 | Temp 98.6°F | Ht 72.84 in | Wt 163.6 lb

## 2021-05-27 DIAGNOSIS — H9311 Tinnitus, right ear: Secondary | ICD-10-CM

## 2021-05-27 MED ORDER — AMOXICILLIN-POT CLAVULANATE 500-125 MG PO TABS: 1.0000 | ORAL_TABLET | Freq: Two times a day (BID) | ORAL | 0 refills | Status: DC

## 2021-05-27 MED ORDER — FEXOFENADINE HCL 180 MG PO TABS: 180.0000 mg | ORAL_TABLET | Freq: Every day | ORAL | 1 refills | Status: DC

## 2021-05-27 MED ORDER — AMOXICILLIN-POT CLAVULANATE 500-125 MG PO TABS: 1.0000 | ORAL_TABLET | Freq: Two times a day (BID) | ORAL | 0 refills | Status: AC

## 2021-05-27 NOTE — Progress Notes (Signed)
BP 133/85    Pulse 64    Temp 98.6 F (37 C) (Oral)    Ht 6' 0.84" (1.85 m)    Wt 163 lb 9.6 oz (74.2 kg)    SpO2 95%    BMI 21.68 kg/m    Subjective:    Patient ID: Dustin Casey, male    DOB: Sep 27, 1950, 71 y.o.   MRN: [id number]  Chief Complaint  Patient presents with   Tinnitus    Started Friday, patient states that he feels like if is full of water.      HPI: Dustin Casey is a 71 y.o. male  Ear Fullness  There is pain in the right (started thurs or friday better now, no fever but still has ringing in the right ear , feels full in the right ear.) ear. This is a new problem. The current episode started in the past 7 days. There has been no fever. Pertinent negatives include no abdominal pain, coughing, diarrhea, ear discharge, headaches, hearing loss, neck pain, rash, rhinorrhea, sore throat or vomiting.   Chief Complaint  Patient presents with   Tinnitus    Started Friday, patient states that he feels like if is full of water.      Relevant past medical, surgical, family and social history reviewed and updated as indicated. Interim medical history since our last visit reviewed. Allergies and medications reviewed and updated.  Review of Systems  HENT:  Negative for ear discharge, hearing loss, rhinorrhea and sore throat.   Respiratory:  Negative for cough.   Gastrointestinal:  Negative for abdominal pain, diarrhea and vomiting.  Musculoskeletal:  Negative for neck pain.  Skin:  Negative for rash.  Neurological:  Negative for headaches.   Per HPI unless specifically indicated above     Objective:    BP 133/85    Pulse 64    Temp 98.6 F (37 C) (Oral)    Ht 6' 0.84" (1.85 m)    Wt 163 lb 9.6 oz (74.2 kg)    SpO2 95%    BMI 21.68 kg/m   Wt Readings from Last 3 Encounters:  05/27/21 163 lb 9.6 oz (74.2 kg)  04/01/20 158 lb 6.4 oz (71.8 kg)  09/29/19 149 lb 9.6 oz (67.9 kg)    Physical Exam  Results for orders placed or performed in visit on 08/31/19  CBC  with Differential/Platelet  Result Value Ref Range   WBC 8.2 3.4 - 10.8 x10E3/uL   RBC 4.99 4.14 - 5.80 x10E6/uL   Hemoglobin 15.1 13.0 - 17.7 g/dL   Hematocrit 45.1 37.5 - 51.0 %   MCV 90 79 - 97 fL   MCH 30.3 26.6 - 33.0 pg   MCHC 33.5 31.5 - 35.7 g/dL   RDW 12.2 11.6 - 15.4 %   Platelets 263 150 - 450 x10E3/uL   Neutrophils 74 Not Estab. %   Lymphs 18 Not Estab. %   Monocytes 7 Not Estab. %   Eos 0 Not Estab. %   Basos 1 Not Estab. %   Neutrophils Absolute 6.0 1.4 - 7.0 x10E3/uL   Lymphocytes Absolute 1.5 0.7 - 3.1 x10E3/uL   Monocytes Absolute 0.5 0.1 - 0.9 x10E3/uL   EOS (ABSOLUTE) 0.0 0.0 - 0.4 x10E3/uL   Basophils Absolute 0.1 0.0 - 0.2 x10E3/uL   Immature Granulocytes 0 Not Estab. %   Immature Grans (Abs) 0.0 0.0 - 0.1 x10E3/uL  Comprehensive metabolic panel  Result Value Ref Range   Glucose 106 (H)  65 - 99 mg/dL   BUN 15 8 - 27 mg/dL   Creatinine, Ser 1.10 0.76 - 1.27 mg/dL   GFR calc non Af Amer 70 >59 mL/min/1.73   GFR calc Af Amer 80 >59 mL/min/1.73   BUN/Creatinine Ratio 14 10 - 24   Sodium 139 134 - 144 mmol/L   Potassium 4.2 3.5 - 5.2 mmol/L   Chloride 102 96 - 106 mmol/L   CO2 25 20 - 29 mmol/L   Calcium 9.8 8.6 - 10.2 mg/dL   Total Protein 7.1 6.0 - 8.5 g/dL   Albumin 4.5 3.8 - 4.8 g/dL   Globulin, Total 2.6 1.5 - 4.5 g/dL   Albumin/Globulin Ratio 1.7 1.2 - 2.2   Bilirubin Total 0.2 0.0 - 1.2 mg/dL   Alkaline Phosphatase 56 39 - 117 IU/L   AST 15 0 - 40 IU/L   ALT 13 0 - 44 IU/L  Lipid Panel w/o Chol/HDL Ratio  Result Value Ref Range   Cholesterol, Total 207 (H) 100 - 199 mg/dL   Triglycerides 271 (H) 0 - 149 mg/dL   HDL 47 >39 mg/dL   VLDL Cholesterol Cal 47 (H) 5 - 40 mg/dL   LDL Chol Calc (NIH) 113 (H) 0 - 99 mg/dL  TSH  Result Value Ref Range   TSH 1.790 0.450 - 4.500 uIU/mL  UA/M w/rflx Culture, Routine   Specimen: Urine   URINE  Result Value Ref Range   Specific Gravity, UA 1.020 1.005 - 1.030   pH, UA 5.5 5.0 - 7.5   Color, UA Yellow  Yellow   Appearance Ur Clear Clear   Leukocytes,UA Negative Negative   Protein,UA Negative Negative/Trace   Glucose, UA Negative Negative   Ketones, UA Trace (A) Negative   RBC, UA Negative Negative   Bilirubin, UA Negative Negative   Urobilinogen, Ur 0.2 0.2 - 1.0 mg/dL   Nitrite, UA Negative Negative  PSA  Result Value Ref Range   Prostate Specific Ag, Serum 3.5 0.0 - 4.0 ng/mL  Hgb A1c w/o eAG  Result Value Ref Range   Hgb A1c MFr Bld 5.8 (H) 4.8 - 5.6 %  Specimen status report  Result Value Ref Range   specimen status report Comment         Current Outpatient Medications:    fexofenadine (ALLEGRA ALLERGY) 180 MG tablet, Take 1 tablet (180 mg total) by mouth daily., Disp: 10 tablet, Rfl: 1   amoxicillin-clavulanate (AUGMENTIN) 500-125 MG tablet, Take 1 tablet (500 mg total) by mouth 2 (two) times daily for 7 days., Disp: 14 tablet, Rfl: 0   fluticasone (FLONASE) 50 MCG/ACT nasal spray, Place 1 spray into both nostrils in the morning and at bedtime. (Patient not taking: Reported on 05/27/2021), Disp: 16 g, Rfl: 6    Assessment & Plan:  1.tinnitus / ear pain / congestion  Will start pt on augmentin and flonase  Refer to ENT for tinnitus  Fu with pcp for further mx and care. Marland Kitchenavear   Problem List Items Addressed This Visit   None Visit Diagnoses     Tinnitus of right ear    -  Primary   Relevant Orders   Ambulatory referral to ENT        Orders Placed This Encounter  Procedures   Ambulatory referral to ENT     Meds ordered this encounter  Medications   DISCONTD: amoxicillin-clavulanate (AUGMENTIN) 500-125 MG tablet    Sig: Take 1 tablet (500 mg total) by mouth 2 (two) times  daily for 5 days.    Dispense:  10 tablet    Refill:  0   fexofenadine (ALLEGRA ALLERGY) 180 MG tablet    Sig: Take 1 tablet (180 mg total) by mouth daily.    Dispense:  10 tablet    Refill:  1   amoxicillin-clavulanate (AUGMENTIN) 500-125 MG tablet    Sig: Take 1 tablet (500 mg total)  by mouth 2 (two) times daily for 7 days.    Dispense:  14 tablet    Refill:  0     Follow up plan: Return in about 4 weeks (around 06/24/2021).

## 2021-06-03 ENCOUNTER — Ambulatory Visit: Payer: Medicare Other

## 2021-06-25 ENCOUNTER — Encounter: Payer: Medicare Other | Admitting: Nurse Practitioner

## 2021-07-02 ENCOUNTER — Encounter: Payer: Medicare Other | Admitting: Nurse Practitioner

## 2021-10-06 ENCOUNTER — Telehealth: Payer: Self-pay | Admitting: Nurse Practitioner

## 2021-10-06 NOTE — Telephone Encounter (Signed)
Copied from CRM [id number]. Topic: Medicare AWV >> Oct 06, 2021  9:57 AM Granville Lewis wrote: Reason for CRM: N/A unable to leave a message for patient to call back and schedule Medicare Annual Wellness Visit (AWV) to be done virtually or by telephone.  No hx of AWV eligible as of 01/08/21  Please schedule at anytime with [facility].      45 Minutes appointment   Any questions, please call me at [phone]

## 2021-11-13 ENCOUNTER — Ambulatory Visit (INDEPENDENT_AMBULATORY_CARE_PROVIDER_SITE_OTHER): Payer: Medicare Other | Admitting: *Deleted

## 2021-11-13 DIAGNOSIS — Z0000 Encounter for general adult medical examination without abnormal findings: Secondary | ICD-10-CM

## 2021-11-13 NOTE — Patient Instructions (Signed)
Dustin Casey , Thank you for taking time to come for your Medicare Wellness Visit. I appreciate your ongoing commitment to your health goals. Please review the following plan we discussed and let me know if I can assist you in the future.   Screening recommendations/referrals: Colonoscopy: Education provided Recommended yearly ophthalmology/optometry visit for glaucoma screening and checkup Recommended yearly dental visit for hygiene and checkup  Vaccinations: Influenza vaccine: Education provided Pneumococcal vaccine: Education provided Tdap vaccine: up to date Shingles vaccine: Education provided    Advanced directives: Education provided  Conditions/risks identified:     Preventive Care 65 Years and Older, Male Preventive care refers to lifestyle choices and visits with your health care provider that can promote health and wellness. What does preventive care include? A yearly physical exam. This is also called an annual well check. Dental exams once or twice a year. Routine eye exams. Ask your health care provider how often you should have your eyes checked. Personal lifestyle choices, including: Daily care of your teeth and gums. Regular physical activity. Eating a healthy diet. Avoiding tobacco and drug use. Limiting alcohol use. Practicing safe sex. Taking low doses of aspirin every day. Taking vitamin and mineral supplements as recommended by your health care provider. What happens during an annual well check? The services and screenings done by your health care provider during your annual well check will depend on your age, overall health, lifestyle risk factors, and family history of disease. Counseling  Your health care provider may ask you questions about your: Alcohol use. Tobacco use. Drug use. Emotional well-being. Home and relationship well-being. Sexual activity. Eating habits. History of falls. Memory and ability to understand (cognition). Work and work  Astronomer. Screening  You may have the following tests or measurements: Height, weight, and BMI. Blood pressure. Lipid and cholesterol levels. These may be checked every 5 years, or more frequently if you are over 49 years old. Skin check. Lung cancer screening. You may have this screening every year starting at age 71 if you have a 30-pack-year history of smoking and currently smoke or have quit within the past 15 years. Fecal occult blood test (FOBT) of the stool. You may have this test every year starting at age 59. Flexible sigmoidoscopy or colonoscopy. You may have a sigmoidoscopy every 5 years or a colonoscopy every 10 years starting at age 31. Prostate cancer screening. Recommendations will vary depending on your family history and other risks. Hepatitis C blood test. Hepatitis B blood test. Sexually transmitted disease (STD) testing. Diabetes screening. This is done by checking your blood sugar (glucose) after you have not eaten for a while (fasting). You may have this done every 1-3 years. Abdominal aortic aneurysm (AAA) screening. You may need this if you are a current or former smoker. Osteoporosis. You may be screened starting at age 30 if you are at high risk. Talk with your health care provider about your test results, treatment options, and if necessary, the need for more tests. Vaccines  Your health care provider may recommend certain vaccines, such as: Influenza vaccine. This is recommended every year. Tetanus, diphtheria, and acellular pertussis (Tdap, Td) vaccine. You may need a Td booster every 10 years. Zoster vaccine. You may need this after age 77. Pneumococcal 13-valent conjugate (PCV13) vaccine. One dose is recommended after age 95. Pneumococcal polysaccharide (PPSV23) vaccine. One dose is recommended after age 24. Talk to your health care provider about which screenings and vaccines you need and how often you need them.  This information is not intended to replace  advice given to you by your health care provider. Make sure you discuss any questions you have with your health care provider. Document Released: 05/03/2015 Document Revised: 12/25/2015 Document Reviewed: 02/05/2015 Elsevier Interactive Patient Education  2017 [employer] Prevention in the Home Falls can cause injuries. They can happen to people of all ages. There are many things you can do to make your home safe and to help prevent falls. What can I do on the outside of my home? Regularly fix the edges of walkways and driveways and fix any cracks. Remove anything that might make you trip as you walk through a door, such as a raised step or threshold. Trim any bushes or trees on the path to your home. Use bright outdoor lighting. Clear any walking paths of anything that might make someone trip, such as rocks or tools. Regularly check to see if handrails are loose or broken. Make sure that both sides of any steps have handrails. Any raised decks and porches should have guardrails on the edges. Have any leaves, snow, or ice cleared regularly. Use sand or salt on walking paths during winter. Clean up any spills in your garage right away. This includes oil or grease spills. What can I do in the bathroom? Use night lights. Install grab bars by the toilet and in the tub and shower. Do not use towel bars as grab bars. Use non-skid mats or decals in the tub or shower. If you need to sit down in the shower, use a plastic, non-slip stool. Keep the floor dry. Clean up any water that spills on the floor as soon as it happens. Remove soap buildup in the tub or shower regularly. Attach bath mats securely with double-sided non-slip rug tape. Do not have throw rugs and other things on the floor that can make you trip. What can I do in the bedroom? Use night lights. Make sure that you have a light by your bed that is easy to reach. Do not use any sheets or blankets that are too big for your bed.  They should not hang down onto the floor. Have a firm chair that has side arms. You can use this for support while you get dressed. Do not have throw rugs and other things on the floor that can make you trip. What can I do in the kitchen? Clean up any spills right away. Avoid walking on wet floors. Keep items that you use a lot in easy-to-reach places. If you need to reach something above you, use a strong step stool that has a grab bar. Keep electrical cords out of the way. Do not use floor polish or wax that makes floors slippery. If you must use wax, use non-skid floor wax. Do not have throw rugs and other things on the floor that can make you trip. What can I do with my stairs? Do not leave any items on the stairs. Make sure that there are handrails on both sides of the stairs and use them. Fix handrails that are broken or loose. Make sure that handrails are as long as the stairways. Check any carpeting to make sure that it is firmly attached to the stairs. Fix any carpet that is loose or worn. Avoid having throw rugs at the top or bottom of the stairs. If you do have throw rugs, attach them to the floor with carpet tape. Make sure that you have a light switch at the  top of the stairs and the bottom of the stairs. If you do not have them, ask someone to add them for you. What else can I do to help prevent falls? Wear shoes that: Do not have high heels. Have rubber bottoms. Are comfortable and fit you well. Are closed at the toe. Do not wear sandals. If you use a stepladder: Make sure that it is fully opened. Do not climb a closed stepladder. Make sure that both sides of the stepladder are locked into place. Ask someone to hold it for you, if possible. Clearly mark and make sure that you can see: Any grab bars or handrails. First and last steps. Where the edge of each step is. Use tools that help you move around (mobility aids) if they are needed. These  include: Canes. Walkers. Scooters. Crutches. Turn on the lights when you go into a dark area. Replace any light bulbs as soon as they burn out. Set up your furniture so you have a clear path. Avoid moving your furniture around. If any of your floors are uneven, fix them. If there are any pets around you, be aware of where they are. Review your medicines with your doctor. Some medicines can make you feel dizzy. This can increase your chance of falling. Ask your doctor what other things that you can do to help prevent falls. This information is not intended to replace advice given to you by your health care provider. Make sure you discuss any questions you have with your health care provider. Document Released: 01/31/2009 Document Revised: 09/12/2015 Document Reviewed: 05/11/2014 Elsevier Interactive Patient Education  2017 [employer].

## 2021-11-13 NOTE — Progress Notes (Signed)
Subjective:   Dustin Casey is a 71 y.o. male who presents for Medicare Annual/Subsequent preventive examination.  I connected with  Dustin Casey on 11/13/21 by a telephone enabled telemedicine application and verified that I am speaking with the correct person using two identifiers.   I discussed the limitations of evaluation and management by telemedicine. The patient expressed understanding and agreed to proceed.  Patient location: home  Provider location: Tele-Health-home   Review of Systems     Cardiac Risk Factors include: advanced age (>30men, >26 women);male gender     Objective:    Today's Vitals   There is no height or weight on file to calculate BMI.     11/13/2021   11:33 AM 02/10/2018    6:16 PM  Advanced Directives  Does Patient Have a Medical Advance Directive? No No  Would patient like information on creating a medical advance directive? No - Patient declined No - Patient declined    Current Medications (verified) Outpatient Encounter Medications as of 11/13/2021  Medication Sig   fexofenadine (ALLEGRA ALLERGY) 180 MG tablet Take 1 tablet (180 mg total) by mouth daily. (Patient not taking: Reported on 11/13/2021)   fluticasone (FLONASE) 50 MCG/ACT nasal spray Place 1 spray into both nostrils in the morning and at bedtime. (Patient not taking: Reported on 05/27/2021)   No facility-administered encounter medications on file as of 11/13/2021.    Allergies (verified) Patient has no known allergies.   History: History reviewed. No pertinent past medical history. Past Surgical History:  Procedure Laterality Date   APPENDECTOMY     KNEE SURGERY     History reviewed. No pertinent family history. Social History   Socioeconomic History   Marital status: Single    Spouse name: Not on file   Number of children: Not on file   Years of education: Not on file   Highest education level: Not on file  Occupational History   Not on file  Tobacco Use    Smoking status: Never   Smokeless tobacco: Never  Vaping Use   Vaping Use: Never used  Substance and Sexual Activity   Alcohol use: Yes    Comment: sociallly   Drug use: Never   Sexual activity: Yes  Other Topics Concern   Not on file  Social History Narrative   Not on file   Social Determinants of Health   Financial Resource Strain: Low Risk  (11/13/2021)   Overall Financial Resource Strain (CARDIA)    Difficulty of Paying Living Expenses: Not hard at all  Food Insecurity: No Food Insecurity (11/13/2021)   Hunger Vital Sign    Worried About Running Out of Food in the Last Year: Never true    Ran Out of Food in the Last Year: Never true  Transportation Needs: Not on file  Physical Activity: Sufficiently Active (11/13/2021)   Exercise Vital Sign    Days of Exercise per Week: 4 days    Minutes of Exercise per Session: 40 min  Stress: No Stress Concern Present (11/13/2021)   [employer] of Occupational Health - Occupational Stress Questionnaire    Feeling of Stress : Not at all  Social Connections: Socially Isolated (11/13/2021)   Social Connection and Isolation Panel [NHANES]    Frequency of Communication with Friends and Family: Never    Frequency of Social Gatherings with Friends and Family: Never    Attends Religious Services: Never    Database administrator or Organizations: No    Attends  Banker Meetings: Never    Marital Status: Never married    Tobacco Counseling Counseling given: Not Answered   Clinical Intake:  Pre-visit preparation completed: Yes  Pain : No/denies pain     Nutritional Risks: None Diabetes: No  How often do you need to have someone help you when you read instructions, pamphlets, or other written materials from your doctor or pharmacy?: 1 - Never  Diabetic?  no  Interpreter Needed?: No  Information entered by :: Remi Haggard LPN   Activities of Daily Living    11/13/2021   11:39 AM  In your present state of  health, do you have any difficulty performing the following activities:  Hearing? 0  Vision? 0  Difficulty concentrating or making decisions? 0  Walking or climbing stairs? 0  Dressing or bathing? 0  Doing errands, shopping? 0  Preparing Food and eating ? N  Using the Toilet? N  In the past six months, have you accidently leaked urine? N  Do you have problems with loss of bowel control? N  Managing your Medications? N  Managing your Finances? N  Housekeeping or managing your Housekeeping? N    Patient Care Team: Larae Grooms, NP as PCP - General  Indicate any recent Medical Services you may have received from other than [facility] providers in the past year (date may be approximate).     Assessment:   This is a routine wellness examination for Dustin Casey.  Hearing/Vision screen Hearing Screening - Comments:: No trouble hearing Vision Screening - Comments:: Dustin Casey Eye Up to date  Dietary issues and exercise activities discussed: Current Exercise Habits: Home exercise routine, Type of exercise: walking, Time (Minutes): 35, Frequency (Times/Week): 4, Weekly Exercise (Minutes/Week): 140, Intensity: Mild   Goals Addressed             This Visit's Progress    Patient Stated       More active       Depression Screen    11/13/2021   11:37 AM 05/27/2021    2:21 PM 08/03/2019    2:19 PM  PHQ 2/9 Scores  PHQ - 2 Score 0 0 0  PHQ- 9 Score  3 0    Fall Risk    05/27/2021    2:21 PM 08/03/2019    2:19 PM  Fall Risk   Falls in the past year? 0 0  Number falls in past yr: 0 0  Injury with Fall? 0 0  Follow up Falls evaluation completed     FALL RISK PREVENTION PERTAINING TO THE HOME:  Any stairs in or around the home? No  If so, are there any without handrails? No  Home free of loose throw rugs in walkways, pet beds, electrical cords, etc? Yes  Adequate lighting in your home to reduce risk of falls? Yes   ASSISTIVE DEVICES UTILIZED TO PREVENT FALLS:  Life alert? No   Use of a cane, walker or w/c? No  Grab bars in the bathroom? No  Shower chair or bench in shower? No  Elevated toilet seat or a handicapped toilet? No   TIMED UP AND GO:  Was the test performed? No .    Cognitive Function:    09/29/2019    2:54 PM  MMSE - Mini Mental State Exam  Orientation to time 5  Orientation to Place 5  Registration 3  Attention/ Calculation 0  Recall 2  Language- name 2 objects 2  Language- repeat 1  Language- follow 3  step command 3  Language- read & follow direction 1  Write a sentence 0  Copy design 0  Total score 22        11/13/2021   11:34 AM  6CIT Screen  What Year? 0 points  What month? 0 points  What time? 0 points  Count back from 20 0 points  Months in reverse 0 points  Repeat phrase 0 points  Total Score 0 points    Immunizations Immunization History  Administered Date(s) Administered   Tdap 02/10/2018    TDAP status: Up to date  Flu Vaccine status: Declined, Education has been provided regarding the importance of this vaccine but patient still declined. Advised may receive this vaccine at local pharmacy or Health Dept. Aware to provide a copy of the vaccination record if obtained from local pharmacy or Health Dept. Verbalized acceptance and understanding.  Pneumococcal vaccine status: Declined,  Education has been provided regarding the importance of this vaccine but patient still declined. Advised may receive this vaccine at local pharmacy or Health Dept. Aware to provide a copy of the vaccination record if obtained from local pharmacy or Health Dept. Verbalized acceptance and understanding.   Covid-19 vaccine status: Declined, Education has been provided regarding the importance of this vaccine but patient still declined. Advised may receive this vaccine at local pharmacy or Health Dept.or vaccine clinic. Aware to provide a copy of the vaccination record if obtained from local pharmacy or Health Dept. Verbalized acceptance  and understanding.  Qualifies for Shingles Vaccine? Yes   Zostavax completed No   Shingrix Completed?: No.    Education has been provided regarding the importance of this vaccine. Patient has been advised to call insurance company to determine out of pocket expense if they have not yet received this vaccine. Advised may also receive vaccine at local pharmacy or Health Dept. Verbalized acceptance and understanding.  Screening Tests Health Maintenance  Topic Date Due   Hepatitis C Screening  Never done   COVID-19 Vaccine (1) 11/29/2021 (Originally 08/19/1951)   Zoster Vaccines- Shingrix (1 of 2) 02/13/2022 (Originally 02/20/1970)   Pneumonia Vaccine 24+ Years old (1 - PCV) 11/14/2022 (Originally 02/19/2018)   COLONOSCOPY (Pts 45-26yrs Insurance coverage will need to be confirmed)  11/14/2022 (Originally 02/19/1998)   INFLUENZA VACCINE  11/18/2021   TETANUS/TDAP  02/11/2028   HPV VACCINES  Aged Out    Health Maintenance  Health Maintenance Due  Topic Date Due   Hepatitis C Screening  Never done    Colonoscopy Declined  Lung Cancer Screening: (Low Dose CT Chest recommended if Age 70-80 years, 30 pack-year currently smoking OR have quit w/in 15years.) does not qualify.   Lung Cancer Screening Referral:   Additional Screening:  Hepatitis C Screening: does qualify  Vision Screening: Recommended annual ophthalmology exams for early detection of glaucoma and other disorders of the eye. Is the patient up to date with their annual eye exam?  Yes  Who is the provider or what is the name of the office in which the patient attends annual eye exams? [facility] If pt is not established with a provider, would they like to be referred to a provider to establish care? No .   Dental Screening: Recommended annual dental exams for proper oral hygiene  Community Resource Referral / Chronic Care Management: CRR required this visit?  No   CCM required this visit?  No      Plan:     I have  personally reviewed and noted  the following in the patient's chart:   Medical and social history Use of alcohol, tobacco or illicit drugs  Current medications and supplements including opioid prescriptions. Patient is not currently taking opioid prescriptions. Functional ability and status Nutritional status Physical activity Advanced directives List of other physicians Hospitalizations, surgeries, and ER visits in previous 12 months Vitals Screenings to include cognitive, depression, and falls Referrals and appointments  In addition, I have reviewed and discussed with patient certain preventive protocols, quality metrics, and best practice recommendations. A written personalized care plan for preventive services as well as general preventive health recommendations were provided to patient.     Remi Haggard, LPN   [id number]   Nurse Notes:

## 2022-02-04 ENCOUNTER — Encounter: Payer: Self-pay | Admitting: Nurse Practitioner

## 2022-02-26 DIAGNOSIS — E7800 Pure hypercholesterolemia, unspecified: Secondary | ICD-10-CM | POA: Insufficient documentation

## 2022-02-26 DIAGNOSIS — R7303 Prediabetes: Secondary | ICD-10-CM | POA: Insufficient documentation

## 2022-02-26 NOTE — Progress Notes (Signed)
BP 138/86   Pulse 77   Temp 98.5 F (36.9 C) (Oral)   Ht 5\' 7"  (1.702 m)   Wt 164 lb 12.8 oz (74.8 kg)   SpO2 96%   BMI 25.81 kg/m    Subjective:    Patient ID: Dustin Casey, male    DOB: [birth year], 71 y.o.   MRN: [id number]  HPI: Dustin Casey is a 71 y.o. male presenting on 03/02/2022 for comprehensive medical examination. Current medical complaints include:none  He currently lives with: Interim Problems from his last visit: no  HYPERTENSION / HYPERLIPIDEMIA Satisfied with current treatment? yes Duration of hypertension: years BP monitoring frequency: not checking BP range:  BP medication side effects: no Past BP meds: none Duration of hyperlipidemia: years Cholesterol medication side effects: no Cholesterol supplements: none Past cholesterol medications: none Medication compliance: excellent compliance Aspirin: no Recent stressors: no Recurrent headaches: no Visual changes: no Palpitations: no Dyspnea: no Chest pain: no Lower extremity edema: no Dizzy/lightheaded: no   Depression Screen done today and results listed below:     03/02/2022   10:05 AM 11/13/2021   11:37 AM 05/27/2021    2:21 PM 08/03/2019    2:19 PM  Depression screen PHQ 2/9  Decreased Interest 0 0 0 0  Down, Depressed, Hopeless 0 0 0 0  PHQ - 2 Score 0 0 0 0  Altered sleeping 0  0 0  Tired, decreased energy 0  0 0  Change in appetite 0  0 0  Feeling bad or failure about yourself  0  0 0  Trouble concentrating 0  2 0  Moving slowly or fidgety/restless 0  1 0  Suicidal thoughts 0  0 0  PHQ-9 Score 0  3 0  Difficult doing work/chores Not difficult at all       The patient does not have a history of falls. I did complete a risk assessment for falls. A plan of care for falls was documented.   Past Medical History:  History reviewed. No pertinent past medical history.  Surgical History:  Past Surgical History:  Procedure Laterality Date   APPENDECTOMY     KNEE SURGERY       Medications:  No current outpatient medications on file prior to visit.   No current facility-administered medications on file prior to visit.    Allergies:  No Known Allergies  Social History:  Social History   Socioeconomic History   Marital status: Single    Spouse name: Not on file   Number of children: Not on file   Years of education: Not on file   Highest education level: Not on file  Occupational History   Not on file  Tobacco Use   Smoking status: Never   Smokeless tobacco: Never  Vaping Use   Vaping Use: Never used  Substance and Sexual Activity   Alcohol use: Yes    Comment: sociallly   Drug use: Never   Sexual activity: Yes  Other Topics Concern   Not on file  Social History Narrative   Not on file   Social Determinants of Health   Financial Resource Strain: Low Risk  (11/13/2021)   Overall Financial Resource Strain (CARDIA)    Difficulty of Paying Living Expenses: Not hard at all  Food Insecurity: No Food Insecurity (11/13/2021)   Hunger Vital Sign    Worried About Running Out of Food in the Last Year: Never true    Ran Out of Food in the Last  Year: Never true  Transportation Needs: Not on file  Physical Activity: Sufficiently Active (11/13/2021)   Exercise Vital Sign    Days of Exercise per Week: 4 days    Minutes of Exercise per Session: 40 min  Stress: No Stress Concern Present (11/13/2021)   [employer] - Occupational Stress Questionnaire    Feeling of Stress : Not at all  Social Connections: Socially Isolated (11/13/2021)   Social Connection and Isolation Panel [NHANES]    Frequency of Communication with Friends and Family: Never    Frequency of Social Gatherings with Friends and Family: Never    Attends Religious Services: Never    Database administratorActive Member of Clubs or Organizations: No    Attends BankerClub or Organization Meetings: Never    Marital Status: Never married  Intimate Partner Violence: Not At Risk (11/13/2021)    Humiliation, Afraid, Rape, and Kick questionnaire    Fear of Current or Ex-Partner: No    Emotionally Abused: No    Physically Abused: No    Sexually Abused: No   Social History   Tobacco Use  Smoking Status Never  Smokeless Tobacco Never   Social History   Substance and Sexual Activity  Alcohol Use Yes   Comment: sociallly    Family History:  History reviewed. No pertinent family history.  Past medical history, surgical history, medications, allergies, family history and social history reviewed with patient today and changes made to appropriate areas of the chart.   Review of Systems  Eyes:  Negative for blurred vision and double vision.  Respiratory:  Negative for shortness of breath.   Cardiovascular:  Negative for chest pain, palpitations and leg swelling.  Neurological:  Negative for dizziness and headaches.   All other ROS negative except what is listed above and in the HPI.      Objective:    BP 138/86   Pulse 77   Temp 98.5 F (36.9 C) (Oral)   Ht 5\' 7"  (1.702 m)   Wt 164 lb 12.8 oz (74.8 kg)   SpO2 96%   BMI 25.81 kg/m   Wt Readings from Last 3 Encounters:  03/02/22 164 lb 12.8 oz (74.8 kg)  05/27/21 163 lb 9.6 oz (74.2 kg)  04/01/20 158 lb 6.4 oz (71.8 kg)    Physical Exam Vitals and nursing note reviewed.  Constitutional:      General: He is not in acute distress.    Appearance: Normal appearance. He is not ill-appearing, toxic-appearing or diaphoretic.  HENT:     Head: Normocephalic.     Right Ear: Tympanic membrane, ear canal and external ear normal.     Left Ear: Tympanic membrane, ear canal and external ear normal.     Nose: Nose normal. No congestion or rhinorrhea.     Mouth/Throat:     Mouth: Mucous membranes are moist.  Eyes:     General:        Right eye: No discharge.        Left eye: No discharge.     Extraocular Movements: Extraocular movements intact.     Conjunctiva/sclera: Conjunctivae normal.     Pupils: Pupils are equal,  round, and reactive to light.  Cardiovascular:     Rate and Rhythm: Normal rate and regular rhythm.     Heart sounds: No murmur heard. Pulmonary:     Effort: Pulmonary effort is normal. No respiratory distress.     Breath sounds: Normal breath sounds. No wheezing, rhonchi or rales.  Abdominal:     General: Abdomen is flat. Bowel sounds are normal. There is no distension.     Palpations: Abdomen is soft.     Tenderness: There is no abdominal tenderness. There is no guarding.  Musculoskeletal:     Cervical back: Normal range of motion and neck supple.  Skin:    General: Skin is warm and dry.     Capillary Refill: Capillary refill takes less than 2 seconds.  Neurological:     General: No focal deficit present.     Mental Status: He is alert and oriented to person, place, and time.     Cranial Nerves: No cranial nerve deficit.     Motor: No weakness.     Deep Tendon Reflexes: Reflexes normal.  Psychiatric:        Mood and Affect: Mood normal.        Behavior: Behavior normal.        Thought Content: Thought content normal.        Judgment: Judgment normal.     Results for orders placed or performed in visit on 08/31/19  CBC with Differential/Platelet  Result Value Ref Range   WBC 8.2 3.4 - 10.8 x10E3/uL   RBC 4.99 4.14 - 5.80 x10E6/uL   Hemoglobin 15.1 13.0 - 17.7 g/dL   Hematocrit 16.1 09.6 - 51.0 %   MCV 90 79 - 97 fL   MCH 30.3 26.6 - 33.0 pg   MCHC 33.5 31.5 - 35.7 g/dL   RDW 04.5 40.9 - 81.1 %   Platelets 263 150 - 450 x10E3/uL   Neutrophils 74 Not Estab. %   Lymphs 18 Not Estab. %   Monocytes 7 Not Estab. %   Eos 0 Not Estab. %   Basos 1 Not Estab. %   Neutrophils Absolute 6.0 1.4 - 7.0 x10E3/uL   Lymphocytes Absolute 1.5 0.7 - 3.1 x10E3/uL   Monocytes Absolute 0.5 0.1 - 0.9 x10E3/uL   EOS (ABSOLUTE) 0.0 0.0 - 0.4 x10E3/uL   Basophils Absolute 0.1 0.0 - 0.2 x10E3/uL   Immature Granulocytes 0 Not Estab. %   Immature Grans (Abs) 0.0 0.0 - 0.1 x10E3/uL   Comprehensive metabolic panel  Result Value Ref Range   Glucose 106 (H) 65 - 99 mg/dL   BUN 15 8 - 27 mg/dL   Creatinine, Ser 9.14 0.76 - 1.27 mg/dL   GFR calc non Af Amer 70 >59 mL/min/1.73   GFR calc Af Amer 80 >59 mL/min/1.73   BUN/Creatinine Ratio 14 10 - 24   Sodium 139 134 - 144 mmol/L   Potassium 4.2 3.5 - 5.2 mmol/L   Chloride 102 96 - 106 mmol/L   CO2 25 20 - 29 mmol/L   Calcium 9.8 8.6 - 10.2 mg/dL   Total Protein 7.1 6.0 - 8.5 g/dL   Albumin 4.5 3.8 - 4.8 g/dL   Globulin, Total 2.6 1.5 - 4.5 g/dL   Albumin/Globulin Ratio 1.7 1.2 - 2.2   Bilirubin Total 0.2 0.0 - 1.2 mg/dL   Alkaline Phosphatase 56 39 - 117 IU/L   AST 15 0 - 40 IU/L   ALT 13 0 - 44 IU/L  Lipid Panel w/o Chol/HDL Ratio  Result Value Ref Range   Cholesterol, Total 207 (H) 100 - 199 mg/dL   Triglycerides 782 (H) 0 - 149 mg/dL   HDL 47 >95 mg/dL   VLDL Cholesterol Cal 47 (H) 5 - 40 mg/dL   LDL Chol Calc (NIH) 621 (H) 0 - 99 mg/dL  TSH  Result Value Ref Range   TSH 1.790 0.450 - 4.500 uIU/mL  UA/M w/rflx Culture, Routine   Specimen: Urine   URINE  Result Value Ref Range   Specific Gravity, UA 1.020 1.005 - 1.030   pH, UA 5.5 5.0 - 7.5   Color, UA Yellow Yellow   Appearance Ur Clear Clear   Leukocytes,UA Negative Negative   Protein,UA Negative Negative/Trace   Glucose, UA Negative Negative   Ketones, UA Trace (A) Negative   RBC, UA Negative Negative   Bilirubin, UA Negative Negative   Urobilinogen, Ur 0.2 0.2 - 1.0 mg/dL   Nitrite, UA Negative Negative  PSA  Result Value Ref Range   Prostate Specific Ag, Serum 3.5 0.0 - 4.0 ng/mL  Hgb A1c w/o eAG  Result Value Ref Range   Hgb A1c MFr Bld 5.8 (H) 4.8 - 5.6 %  Specimen status report  Result Value Ref Range   specimen status report Comment       Assessment & Plan:   Problem List Items Addressed This Visit       Other   Prediabetes    Chronic.  Controlled without medication.  Labs ordered today.  Return to clinic in 6 months for  reevaluation.  Call sooner if concerns arise.       Relevant Orders   HgB A1c   Hypercholesteremia    Chronic.  Controlled without medication.  Labs ordered today.  Return to clinic in 6 months for reevaluation.  Call sooner if concerns arise.        Relevant Orders   Lipid panel   Other Visit Diagnoses     Annual physical exam    -  Primary   Health maintenance reviewed during visit today.  Labs ordered.  Declined vaccines.  Declined Colon Cancer screening.   Relevant Orders   TSH   PSA   Lipid panel   CBC with Differential/Platelet   Comprehensive metabolic panel   Urinalysis, Routine w reflex microscopic   HgB A1c        Discussed aspirin prophylaxis for myocardial infarction prevention and decision was it was not indicated  LABORATORY TESTING:  Health maintenance labs ordered today as discussed above.   The natural history of prostate cancer and ongoing controversy regarding screening and potential treatment outcomes of prostate cancer has been discussed with the patient. The meaning of a false positive PSA and a false negative PSA has been discussed. He indicates understanding of the limitations of this screening test and wishes to proceed with screening PSA testing.   IMMUNIZATIONS:   - Tdap: Tetanus vaccination status reviewed: last tetanus booster within 10 years. - Influenza: Refused - Pneumovax: Refused - Prevnar: Refused - COVID: Not applicable - HPV: Not applicable - Shingrix vaccine: Refused  SCREENING: - Colonoscopy: Refused  Discussed with patient purpose of the colonoscopy is to detect colon cancer at curable precancerous or early stages   - AAA Screening: Not applicable  -Hearing Test: Not applicable  -Spirometry: Not applicable   PATIENT COUNSELING:    Sexuality: Discussed sexually transmitted diseases, partner selection, use of condoms, avoidance of unintended pregnancy  and contraceptive alternatives.   Advised to avoid cigarette  smoking.  I discussed with the patient that most people either abstain from alcohol or drink within safe limits (<=14/week and <=4 drinks/occasion for males, <=7/weeks and <= 3 drinks/occasion for females) and that the risk for alcohol disorders and other health effects rises proportionally with the number of drinks per  week and how often a drinker exceeds daily limits.  Discussed cessation/primary prevention of drug use and availability of treatment for abuse.   Diet: Encouraged to adjust caloric intake to maintain  or achieve ideal body weight, to reduce intake of dietary saturated fat and total fat, to limit sodium intake by avoiding high sodium foods and not adding table salt, and to maintain adequate dietary potassium and calcium preferably from fresh fruits, vegetables, and low-fat dairy products.    stressed the importance of regular exercise  Injury prevention: Discussed safety belts, safety helmets, smoke detector, smoking near bedding or upholstery.   Dental health: Discussed importance of regular tooth brushing, flossing, and dental visits.   Follow up plan: NEXT PREVENTATIVE PHYSICAL DUE IN 1 YEAR. Return in about 6 months (around 08/31/2022) for HTN, HLD, DM2 FU.

## 2022-03-02 ENCOUNTER — Ambulatory Visit (INDEPENDENT_AMBULATORY_CARE_PROVIDER_SITE_OTHER): Payer: Medicare Other | Admitting: Nurse Practitioner

## 2022-03-02 ENCOUNTER — Encounter: Payer: Self-pay | Admitting: Nurse Practitioner

## 2022-03-02 VITALS — BP 138/86 | HR 77 | Temp 98.5°F | Ht 67.0 in | Wt 164.8 lb

## 2022-03-02 DIAGNOSIS — E7800 Pure hypercholesterolemia, unspecified: Secondary | ICD-10-CM

## 2022-03-02 DIAGNOSIS — Z0000 Encounter for general adult medical examination without abnormal findings: Secondary | ICD-10-CM | POA: Diagnosis not present

## 2022-03-02 DIAGNOSIS — R7303 Prediabetes: Secondary | ICD-10-CM

## 2022-03-02 DIAGNOSIS — R972 Elevated prostate specific antigen [PSA]: Secondary | ICD-10-CM | POA: Diagnosis not present

## 2022-03-02 LAB — MICROSCOPIC EXAMINATION
Bacteria, UA: NONE SEEN
Epithelial Cells (non renal): NONE SEEN /hpf (ref 0–10)
RBC, Urine: NONE SEEN /hpf (ref 0–2)

## 2022-03-02 LAB — URINALYSIS, ROUTINE W REFLEX MICROSCOPIC
Bilirubin, UA: NEGATIVE
Glucose, UA: NEGATIVE
Ketones, UA: NEGATIVE
Leukocytes,UA: NEGATIVE
Nitrite, UA: NEGATIVE
Specific Gravity, UA: 1.025 (ref 1.005–1.030)
Urobilinogen, Ur: 0.2 mg/dL (ref 0.2–1.0)
pH, UA: 5 (ref 5.0–7.5)

## 2022-03-02 NOTE — Assessment & Plan Note (Signed)
Chronic.  Controlled without medication..  Labs ordered today.  Return to clinic in 6 months for reevaluation.  Call sooner if concerns arise.  ° °

## 2022-03-03 LAB — CBC WITH DIFFERENTIAL/PLATELET
Basophils Absolute: 0.1 10*3/uL (ref 0.0–0.2)
Basos: 1 %
EOS (ABSOLUTE): 0.1 10*3/uL (ref 0.0–0.4)
Eos: 1 %
Hematocrit: 46 % (ref 37.5–51.0)
Hemoglobin: 15.4 g/dL (ref 13.0–17.7)
Immature Grans (Abs): 0 10*3/uL (ref 0.0–0.1)
Immature Granulocytes: 0 %
Lymphocytes Absolute: 1.8 10*3/uL (ref 0.7–3.1)
Lymphs: 31 %
MCH: 30.1 pg (ref 26.6–33.0)
MCHC: 33.5 g/dL (ref 31.5–35.7)
MCV: 90 fL (ref 79–97)
Monocytes Absolute: 0.5 10*3/uL (ref 0.1–0.9)
Monocytes: 8 %
Neutrophils Absolute: 3.4 10*3/uL (ref 1.4–7.0)
Neutrophils: 59 %
Platelets: 237 10*3/uL (ref 150–450)
RBC: 5.12 x10E6/uL (ref 4.14–5.80)
RDW: 12.2 % (ref 11.6–15.4)
WBC: 5.9 10*3/uL (ref 3.4–10.8)

## 2022-03-03 LAB — LIPID PANEL
Chol/HDL Ratio: 4.6 ratio (ref 0.0–5.0)
Cholesterol, Total: 216 mg/dL — ABNORMAL HIGH (ref 100–199)
HDL: 47 mg/dL (ref >39)
LDL Chol Calc (NIH): 135 mg/dL — ABNORMAL HIGH (ref 0–99)
Triglycerides: 189 mg/dL — ABNORMAL HIGH (ref 0–149)
VLDL Cholesterol Cal: 34 mg/dL (ref 5–40)

## 2022-03-03 LAB — COMPREHENSIVE METABOLIC PANEL
ALT: 20 IU/L (ref 0–44)
AST: 18 IU/L (ref 0–40)
Albumin/Globulin Ratio: 1.9 (ref 1.2–2.2)
Albumin: 4.6 g/dL (ref 3.9–4.9)
Alkaline Phosphatase: 53 IU/L (ref 44–121)
BUN/Creatinine Ratio: 13 (ref 10–24)
BUN: 13 mg/dL (ref 8–27)
Bilirubin Total: 0.2 mg/dL (ref 0.0–1.2)
CO2: 23 mmol/L (ref 20–29)
Calcium: 9.6 mg/dL (ref 8.6–10.2)
Chloride: 101 mmol/L (ref 96–106)
Creatinine, Ser: 1.04 mg/dL (ref 0.76–1.27)
Globulin, Total: 2.4 g/dL (ref 1.5–4.5)
Glucose: 102 mg/dL — ABNORMAL HIGH (ref 70–99)
Potassium: 3.9 mmol/L (ref 3.5–5.2)
Sodium: 140 mmol/L (ref 134–144)
Total Protein: 7 g/dL (ref 6.0–8.5)
eGFR: 78 mL/min/{1.73_m2} (ref >59)

## 2022-03-03 LAB — PSA: Prostate Specific Ag, Serum: 4.1 ng/mL — ABNORMAL HIGH (ref 0.0–4.0)

## 2022-03-03 LAB — TSH: TSH: 2.26 u[IU]/mL (ref 0.450–4.500)

## 2022-03-03 LAB — HEMOGLOBIN A1C
Est. average glucose Bld gHb Est-mCnc: 128 mg/dL
Hgb A1c MFr Bld: 6.1 % — ABNORMAL HIGH (ref 4.8–5.6)

## 2022-03-03 NOTE — Progress Notes (Signed)
Please let patient know that his prostate cancer screening is elevated. I'd like him to come back in two weeks to repeat it.  Please make him an appt.   Please let patient know that his cholesterol is elevated.  His cardiac risk score puts him at high risk of having a stroke or heart attack over the next 10 years.  I recommend that he start a statin called crestor 5mg  daily.  The goal will be to increase this to 20mg  daily if patient tolerates it well.  If he agrees to the medication I can send it to the pharmacy.    A1c is well controlled in the prediabetic at 6.1.

## 2022-03-03 NOTE — Addendum Note (Signed)
Addended by: Larae Grooms on: 03/03/2022 08:00 AM   Modules accepted: Orders

## 2022-03-04 MED ORDER — ROSUVASTATIN CALCIUM 5 MG PO TABS: 5.0000 mg | ORAL_TABLET | Freq: Every day | ORAL | 1 refills | Status: DC

## 2022-03-04 NOTE — Progress Notes (Signed)
Medication sent to the pharmacy.

## 2022-03-04 NOTE — Addendum Note (Signed)
Addended by: Larae Grooms on: 03/04/2022 07:59 AM   Modules accepted: Orders

## 2022-03-16 ENCOUNTER — Other Ambulatory Visit: Payer: Medicare Other

## 2022-03-16 DIAGNOSIS — R972 Elevated prostate specific antigen [PSA]: Secondary | ICD-10-CM

## 2022-03-17 LAB — PSA: Prostate Specific Ag, Serum: 4.3 ng/mL — ABNORMAL HIGH (ref 0.0–4.0)

## 2022-03-18 NOTE — Progress Notes (Signed)
Please let patient know that his PSA increased from prior.  I have placed a referral for patient to see Urology for further evaluation.

## 2022-04-01 ENCOUNTER — Ambulatory Visit (INDEPENDENT_AMBULATORY_CARE_PROVIDER_SITE_OTHER): Payer: Medicare Other | Admitting: Urology

## 2022-04-01 VITALS — BP 149/65 | HR 70 | Ht 67.0 in | Wt 162.0 lb

## 2022-04-01 DIAGNOSIS — R972 Elevated prostate specific antigen [PSA]: Secondary | ICD-10-CM | POA: Diagnosis not present

## 2022-04-01 NOTE — Progress Notes (Signed)
04/01/2022 3:03 PM   Dustin Casey 10/15/1950 [id number]  Referring provider: Larae Grooms, NP [address] [place],  [place] [address]  Chief Complaint  Patient presents with   Establish Care   Elevated PSA    HPI: 71 year old male referred for further evaluation of elevated PSA.  He was noted to have slightly elevated PSA of 4.1 about a month ago, repeated 2 weeks ago was 4.3.  Prior to this, his PSA was 3.52 years ago.  He denies a personal history of known elevated PSA.  He has no family history of prostate issues.  He has no urinary complaints today other than occasional nocturia x 1.  He feels like he has a good stream and empties well.  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  No dysuria or UTIs.   PMH: No past medical history on file.  Surgical History: Past Surgical History:  Procedure Laterality Date   APPENDECTOMY     KNEE SURGERY      Home Medications:  Allergies as of 04/01/2022   No Known Allergies      Medication List        Accurate as of April 01, 2022  3:03 PM. If you have any questions, ask your nurse or doctor.          rosuvastatin 5 MG tablet Commonly known as: Crestor Take 1 tablet (5 mg total) by mouth daily.        Allergies: No Known Allergies  Family History: No family history on file.  Social History:  reports that he has never smoked. He has never used smokeless tobacco. He reports current alcohol use. He reports that he does not use drugs.   Physical Exam: BP (!) 149/65   Pulse 70   Ht 5\' 7"  (1.702 m)   Wt 162 lb (73.5 kg)   BMI 25.37 kg/m   Constitutional:  Alert and oriented, No acute distress. HEENT: [place] AT, moist mucus membranes.  Trachea midline, no masses. Rectal: Normal sphincter tone.  Large 50+ cc prostate, nontender no nodules Neurologic: Grossly intact, no focal deficits, moving all 4 extremities. Psychiatric: Normal mood and affect.  Laboratory  Data: Lab Results  Component Value Date   WBC 5.9 03/02/2022   HGB 15.4 03/02/2022   HCT 46.0 03/02/2022   MCV 90 03/02/2022   PLT 237 03/02/2022    Lab Results  Component Value Date   CREATININE 1.04 03/02/2022     Assessment & Plan:    1. Elevated PSA  We reviewed the implications of an elevated PSA and the uncertainty surrounding it. In general, a man's PSA increases with age and is produced by both normal and cancerous prostate tissue. Differential for elevated PSA is BPH, prostate cancer, infection, recent intercourse/ejaculation, prostate infarction, recent urethroscopic manipulation (foley placement/cystoscopy) and prostatitis. Management of an elevated PSA can include observation or prostate biopsy and wediscussed this in detail. We discussed that indications for prostate biopsy are defined by age and race specific PSA cutoffs as well as a PSA velocity of 0.75/year.  PSA is not markedly elevated from a few years ago and he does have prostamegaly on exam today, no nodules appreciated.  May be appropriate for his prostate volume.  Will plan to repeat his PSA in about 3 months and then reassess.  May consider prostate MRI versus biopsy at that time if PSA continues to trend upwards. - PSA; Future   03/04/2022, MD  [place] [address] [place], [place] [address] [phone]

## 2022-04-01 NOTE — Patient Instructions (Signed)
We will contact you with results

## 2022-05-18 ENCOUNTER — Ambulatory Visit (INDEPENDENT_AMBULATORY_CARE_PROVIDER_SITE_OTHER): Payer: Medicare Other | Admitting: Nurse Practitioner

## 2022-05-18 ENCOUNTER — Encounter: Payer: Self-pay | Admitting: Nurse Practitioner

## 2022-05-18 VITALS — BP 114/73 | HR 62 | Temp 98.0°F | Wt 158.8 lb

## 2022-05-18 DIAGNOSIS — B070 Plantar wart: Secondary | ICD-10-CM | POA: Diagnosis not present

## 2022-05-18 NOTE — Progress Notes (Signed)
BP 114/73   Pulse 62   Temp 98 F (36.7 C) (Oral)   Wt 158 lb 12.8 oz (72 kg)   SpO2 96%   BMI 24.87 kg/m    Subjective:    Patient ID: Dustin Casey, male    DOB: Sep 28, 1950, 72 y.o.   MRN: [id number]  HPI: Dustin Casey is a 72 y.o. male  Chief Complaint  Patient presents with   Foot Pain    Pt states he has been having pain in his R foot due to a callus. States it has been getting worse over the last 2 months and has become very sore.    FOOT PAIN Patient states he has been having some foot pain on his right foot that started about 2 months ago.  Patient states he has a callus that has become sore.  Denies any fevers, swelling, redness and warmth.  Relevant past medical, surgical, family and social history reviewed and updated as indicated. Interim medical history since our last visit reviewed. Allergies and medications reviewed and updated.  Review of Systems  Skin:        callus    Per HPI unless specifically indicated above     Objective:    BP 114/73   Pulse 62   Temp 98 F (36.7 C) (Oral)   Wt 158 lb 12.8 oz (72 kg)   SpO2 96%   BMI 24.87 kg/m   Wt Readings from Last 3 Encounters:  05/18/22 158 lb 12.8 oz (72 kg)  04/01/22 162 lb (73.5 kg)  03/02/22 164 lb 12.8 oz (74.8 kg)    Physical Exam Vitals and nursing note reviewed.  Constitutional:      General: He is not in acute distress.    Appearance: Normal appearance. He is not ill-appearing, toxic-appearing or diaphoretic.  HENT:     Head: Normocephalic.     Right Ear: External ear normal.     Left Ear: External ear normal.     Nose: Nose normal. No congestion or rhinorrhea.     Mouth/Throat:     Mouth: Mucous membranes are moist.  Eyes:     General:        Right eye: No discharge.        Left eye: No discharge.     Extraocular Movements: Extraocular movements intact.     Conjunctiva/sclera: Conjunctivae normal.     Pupils: Pupils are equal, round, and reactive to light.   Cardiovascular:     Rate and Rhythm: Normal rate and regular rhythm.     Heart sounds: No murmur heard. Pulmonary:     Effort: Pulmonary effort is normal. No respiratory distress.     Breath sounds: Normal breath sounds. No wheezing, rhonchi or rales.  Abdominal:     General: Abdomen is flat. Bowel sounds are normal.  Musculoskeletal:     Cervical back: Normal range of motion and neck supple.       Feet:  Skin:    General: Skin is warm and dry.     Capillary Refill: Capillary refill takes less than 2 seconds.  Neurological:     General: No focal deficit present.     Mental Status: He is alert and oriented to person, place, and time.  Psychiatric:        Mood and Affect: Mood normal.        Behavior: Behavior normal.        Thought Content: Thought content normal.  Judgment: Judgment normal.     Results for orders placed or performed in visit on 03/16/22  PSA  Result Value Ref Range   Prostate Specific Ag, Serum 4.3 (H) 0.0 - 4.0 ng/mL      Assessment & Plan:   Problem List Items Addressed This Visit   None Visit Diagnoses     Plantar wart    -  Primary   Will refer patient to podiatry for further evaluation and management.   Relevant Orders   Ambulatory referral to Podiatry        Follow up plan: Return if symptoms worsen or fail to improve.

## 2022-06-03 ENCOUNTER — Ambulatory Visit: Payer: Medicare Other | Admitting: Podiatry

## 2022-06-03 ENCOUNTER — Encounter: Payer: Self-pay | Admitting: Podiatry

## 2022-06-03 VITALS — BP 132/90 | HR 68

## 2022-06-03 DIAGNOSIS — B070 Plantar wart: Secondary | ICD-10-CM | POA: Diagnosis not present

## 2022-06-03 NOTE — Patient Instructions (Signed)
Look for [id number] strength salicylic acid wart/corn remover pads or cream, use every night until it goes away.

## 2022-06-08 ENCOUNTER — Encounter: Payer: Self-pay | Admitting: Podiatry

## 2022-06-08 NOTE — Progress Notes (Signed)
  Subjective:  Patient ID: Dustin Casey, male    DOB: 03-Jun-1950,  MRN: [id number]  Chief Complaint  Patient presents with   Callouses    "My right foot has a callus on it.  Dr. Mathis Dad sent me here." N - callus  L - 4th met D - 1-2 mos O - suddenly, gotten better C - sore A - walking T - Peroxide, saw Dr. Mathis Dad    72 y.o. male presents with the above complaint. History confirmed with patient.   Objective:  Physical Exam: warm, good capillary refill, no trophic changes or ulcerative lesions, normal DP and PT pulses, normal sensory exam, and right fourth met verruca plantaris  Assessment:   1. Verruca plantaris      Plan:  Patient was evaluated and treated and all questions answered.   Discussed etiology and treatment of verruca plantaris in detail with the patient as well as multiple treatment options including blistering agents, chemotherapeutic agents, surgical excision, laser therapy and the indications and roles of the above.  Today, recommended treatment with salinocaine as noted in procedure note below.   Procedure: Destruction of Lesion Location: Right foot submetatarsal 4 Instrumentation: 15 blade. Technique: Debridement of lesion to petechial bleeding. Aperture pad applied around lesion. Small amount of salinocaine applied to the base of the lesion. Dressing: Dry, sterile, compression dressing. Disposition: Patient tolerated procedure well. Advised to leave dressing on for 6-8 hours. Thereafter patient to wash the area with soap and water and applied band-aid. Off-loading pads dispensed.     Return if symptoms worsen or fail to improve.

## 2022-07-02 ENCOUNTER — Other Ambulatory Visit: Payer: Medicare Other

## 2022-07-07 ENCOUNTER — Other Ambulatory Visit: Payer: Medicare Other

## 2022-08-27 ENCOUNTER — Other Ambulatory Visit: Payer: Self-pay | Admitting: Nurse Practitioner

## 2022-08-27 NOTE — Telephone Encounter (Signed)
Labs in date  Requested Prescriptions  Pending Prescriptions Disp Refills   rosuvastatin (CRESTOR) 5 MG tablet [Pharmacy Med Name: ROSUVASTATIN CALCIUM 5 MG TAB] 90 tablet 1    Sig: TAKE 1 TABLET (5 MG TOTAL) BY MOUTH DAILY.     Cardiovascular:  Antilipid - Statins 2 Failed - 08/27/2022  2:33 AM      Failed - Lipid Panel in normal range within the last 12 months    Cholesterol, Total  Date Value Ref Range Status  03/02/2022 216 (H) 100 - 199 mg/dL Final   LDL Chol Calc (NIH)  Date Value Ref Range Status  03/02/2022 135 (H) 0 - 99 mg/dL Final   HDL  Date Value Ref Range Status  03/02/2022 47 >39 mg/dL Final   Triglycerides  Date Value Ref Range Status  03/02/2022 189 (H) 0 - 149 mg/dL Final         Passed - Cr in normal range and within 360 days    Creatinine  Date Value Ref Range Status  04/29/2012 1.31 (H) 0.60 - 1.30 mg/dL Final   Creatinine, Ser  Date Value Ref Range Status  03/02/2022 1.04 0.76 - 1.27 mg/dL Final         Passed - Patient is not pregnant      Passed - Valid encounter within last 12 months    Recent Outpatient Visits           3 months ago Plantar wart   [place] [facility] Larae Grooms, NP   5 months ago Annual physical exam   [place] [facility] Larae Grooms, NP   1 year ago Tinnitus of right ear   [place] [facility] Vigg, Avanti, MD   2 years ago Presbycusis of both ears   [place] [facility] Gabriel Cirri, NP   2 years ago Concentration deficit   [place] [facility] Gabriel Cirri, NP       Future Appointments             In 4 days Larae Grooms, NP [place] [facility]

## 2022-08-31 ENCOUNTER — Ambulatory Visit: Payer: Medicare Other | Admitting: Nurse Practitioner

## 2022-11-11 ENCOUNTER — Telehealth: Payer: Self-pay | Admitting: Nurse Practitioner

## 2022-11-11 NOTE — Telephone Encounter (Signed)
Copied from CRM [id number]. Topic: Medicare AWV >> Nov 11, 2022 10:22 AM Payton Doughty wrote: Reason for CRM: LVM 11/11/22 to r/s AWV due [facility] not in office on Mondays. New AWV date 11/17/22 at 10:30am. Please confirm date change [facility]  Verlee Rossetti; Care Guide Ambulatory Clinical Support [place] [facility] Direct Dial: [phone]

## 2023-02-26 ENCOUNTER — Other Ambulatory Visit: Payer: Self-pay | Admitting: Nurse Practitioner

## 2023-02-26 NOTE — Telephone Encounter (Signed)
Patient will need an office visit for further refills. Requested Prescriptions  Pending Prescriptions Disp Refills   rosuvastatin (CRESTOR) 5 MG tablet [Pharmacy Med Name: ROSUVASTATIN CALCIUM 5 MG TAB] 90 tablet 0    Sig: TAKE 1 TABLET (5 MG TOTAL) BY MOUTH DAILY.     Cardiovascular:  Antilipid - Statins 2 Failed - 02/26/2023  2:44 AM      Failed - Cr in normal range and within 360 days    Creatinine  Date Value Ref Range Status  04/29/2012 1.31 (H) 0.60 - 1.30 mg/dL Final   Creatinine, Ser  Date Value Ref Range Status  03/02/2022 1.04 0.76 - 1.27 mg/dL Final         Failed - Lipid Panel in normal range within the last 12 months    Cholesterol, Total  Date Value Ref Range Status  03/02/2022 216 (H) 100 - 199 mg/dL Final   LDL Chol Calc (NIH)  Date Value Ref Range Status  03/02/2022 135 (H) 0 - 99 mg/dL Final   HDL  Date Value Ref Range Status  03/02/2022 47 >39 mg/dL Final   Triglycerides  Date Value Ref Range Status  03/02/2022 189 (H) 0 - 149 mg/dL Final         Passed - Patient is not pregnant      Passed - Valid encounter within last 12 months    Recent Outpatient Visits           9 months ago Plantar wart   [place] [facility] Larae Grooms, NP   12 months ago Annual physical exam   [place] [facility] Larae Grooms, NP   1 year ago Tinnitus of right ear   [place] [facility] Vigg, Avanti, MD   2 years ago Presbycusis of both ears   [place] [facility] Gabriel Cirri, NP   3 years ago Concentration deficit    [place] [facility] Gabriel Cirri, NP

## 2023-03-05 ENCOUNTER — Other Ambulatory Visit: Payer: Self-pay | Admitting: Nurse Practitioner

## 2023-03-05 NOTE — Telephone Encounter (Signed)
Resend to [facility]

## 2023-03-05 NOTE — Telephone Encounter (Signed)
Medication Refill -  Most Recent Primary Care Visit:  Provider: Larae Grooms  Department: [facility]  Visit Type: OFFICE VISIT  Date: 05/18/2022  Medication:  rosuvastatin (CRESTOR) 5 MG tablet  Has the patient contacted their pharmacy? Yes, Jessica with [facility] states patient reached out to them about medication, [facility] and [facility] to [facility]  Is this the correct pharmacy for this prescription? Yes, the one listed below  If no, delete pharmacy and type the correct one.  This is the patient's preferred pharmacy:  [facility] - [place], [place] - [address] [place] [address] Phone: [phone] Fax: [phone]   Has the prescription been filled recently? I am not sure.  Is the patient out of the medication? Jessica from the [facility] was not sure.  Has the patient been seen for an appointment in the last year OR does the patient have an upcoming appointment? Last seen in January of 2024

## 2023-03-08 MED ORDER — ROSUVASTATIN CALCIUM 5 MG PO TABS: 5.0000 mg | ORAL_TABLET | Freq: Every day | ORAL | 0 refills | Status: DC

## 2023-03-08 NOTE — Telephone Encounter (Signed)
Requested Prescriptions  Pending Prescriptions Disp Refills   rosuvastatin (CRESTOR) 5 MG tablet 90 tablet 0    Sig: Take 1 tablet (5 mg total) by mouth daily.     Cardiovascular:  Antilipid - Statins 2 Failed - 03/05/2023  3:57 PM      Failed - Cr in normal range and within 360 days    Creatinine  Date Value Ref Range Status  04/29/2012 1.31 (H) 0.60 - 1.30 mg/dL Final   Creatinine, Ser  Date Value Ref Range Status  03/02/2022 1.04 0.76 - 1.27 mg/dL Final         Failed - Lipid Panel in normal range within the last 12 months    Cholesterol, Total  Date Value Ref Range Status  03/02/2022 216 (H) 100 - 199 mg/dL Final   LDL Chol Calc (NIH)  Date Value Ref Range Status  03/02/2022 135 (H) 0 - 99 mg/dL Final   HDL  Date Value Ref Range Status  03/02/2022 47 >39 mg/dL Final   Triglycerides  Date Value Ref Range Status  03/02/2022 189 (H) 0 - 149 mg/dL Final         Passed - Patient is not pregnant      Passed - Valid encounter within last 12 months    Recent Outpatient Visits           9 months ago Plantar wart   [place] [facility] Larae Grooms, NP   1 year ago Annual physical exam   [place] [facility] Larae Grooms, NP   1 year ago Tinnitus of right ear   [place] [facility] Vigg, Avanti, MD   2 years ago Presbycusis of both ears   [place] [facility] Gabriel Cirri, NP   3 years ago Concentration deficit   [place] [facility] Gabriel Cirri, NP

## 2023-03-09 ENCOUNTER — Other Ambulatory Visit: Payer: Self-pay | Admitting: Nurse Practitioner

## 2023-03-09 NOTE — Telephone Encounter (Signed)
Medication Refill -  Most Recent Primary Care Visit:  Provider: Larae Grooms  Department: [facility]  Visit Type: OFFICE VISIT  Date: 05/18/2022  Medication: rosuvastatin (Crestor)  Has the patient contacted their pharmacy? Yes (Agent: If yes, when and what did the pharmacy advise?) Pt was told by [facility] the earliest it could be delivered would be January 2025. Patient requesting it be sent to local pharmacy.  Is this the correct pharmacy for this prescription? Yes This is the patient's preferred pharmacy:  [facility][id number] [place], [place] - [address] AT [facility] [address] [place] [address] Phone: [phone] Fax: [phone]   Has the prescription been filled recently? Yes  Is the patient out of the medication? Yes  Has the patient been seen for an appointment in the last year OR does the patient have an upcoming appointment? No Patient has moved, is looking for new PCP.   Can we respond through MyChart? No  Best call back number: [phone]  Agent: Please be advised that Rx refills may take up to 3 business days. We ask that you follow-up with your pharmacy.

## 2023-03-11 NOTE — Telephone Encounter (Signed)
Requested medication (s) are due for refill today: Yes  Requested medication (s) are on the active medication list: Yes  Last refill:  03/08/23  Future visit scheduled: No  Notes to clinic:  Unable to refill per protocol due to failed labs, no updated results. Patient has moved out of town and will need a refill sent to last until see new provider.     Requested Prescriptions  Pending Prescriptions Disp Refills   rosuvastatin (CRESTOR) 5 MG tablet 90 tablet 0    Sig: Take 1 tablet (5 mg total) by mouth daily.     Cardiovascular:  Antilipid - Statins 2 Failed - 03/09/2023  4:53 PM      Failed - Cr in normal range and within 360 days    Creatinine  Date Value Ref Range Status  04/29/2012 1.31 (H) 0.60 - 1.30 mg/dL Final   Creatinine, Ser  Date Value Ref Range Status  03/02/2022 1.04 0.76 - 1.27 mg/dL Final         Failed - Lipid Panel in normal range within the last 12 months    Cholesterol, Total  Date Value Ref Range Status  03/02/2022 216 (H) 100 - 199 mg/dL Final   LDL Chol Calc (NIH)  Date Value Ref Range Status  03/02/2022 135 (H) 0 - 99 mg/dL Final   HDL  Date Value Ref Range Status  03/02/2022 47 >39 mg/dL Final   Triglycerides  Date Value Ref Range Status  03/02/2022 189 (H) 0 - 149 mg/dL Final         Passed - Patient is not pregnant      Passed - Valid encounter within last 12 months    Recent Outpatient Visits           9 months ago Plantar wart   [place] [facility] Larae Grooms, NP   1 year ago Annual physical exam   [place] [facility] Larae Grooms, NP   1 year ago Tinnitus of right ear   [place] [facility] Vigg, Avanti, MD   2 years ago Presbycusis of both ears   [place] [facility] Gabriel Cirri, NP   3 years ago Concentration deficit   [place] [facility] Gabriel Cirri, NP

## 2023-03-12 ENCOUNTER — Other Ambulatory Visit: Payer: Self-pay | Admitting: Nurse Practitioner

## 2023-03-12 NOTE — Telephone Encounter (Signed)
Pt following up on the change of pharmacy for his rosuvastatin (CRESTOR) 5 MG tablet . to [facility] #[id number] - [place], [place] - [address] .  Pt states he spoke w/ someone on Monday and was told it would be done w/in 3 days.  Can you send this today?  Pt is upset, not w/ [place] but his insurance.

## 2023-03-12 NOTE — Telephone Encounter (Signed)
Can we resend the prescription to a local pharmacy for the patient?

## 2023-03-12 NOTE — Telephone Encounter (Signed)
Viviann Spare with [facility] requests that the Rx for rosuvastatin (CRESTOR) 5 MG tablet  be sent to  [facility] #[id number] [place] Link, [place] - [address] AT [facility] [address] Phone: [phone]  Fax: [phone]

## 2023-03-15 ENCOUNTER — Telehealth: Payer: Self-pay | Admitting: Nurse Practitioner

## 2023-03-15 MED ORDER — ROSUVASTATIN CALCIUM 5 MG PO TABS: 5.0000 mg | ORAL_TABLET | Freq: Every day | ORAL | 0 refills | Status: AC

## 2023-03-15 NOTE — Telephone Encounter (Signed)
Patient has moved, is looking for new PCP (copied from 03/09/23 refill encounter). He would like a refill to last until he can be seen by a new provider.

## 2023-03-15 NOTE — Telephone Encounter (Signed)
90 day supply sent for patient to find new PCP.  Will need to have new PCP for further refills.

## 2023-03-15 NOTE — Telephone Encounter (Signed)
Patient's RX was refused by provider on Friday due to patient not being seen for a follow up in over a year. Last cpe/follow up was 03/02/22. Please call and schedule the patient a follow up visit.

## 2023-03-15 NOTE — Telephone Encounter (Signed)
Copied from CRM [id number]. Topic: General - Other >> Mar 15, 2023  9:56 AM Franchot Heidelberg wrote: Reason for CRM: Pt called following up on request to have his refill of rosuvastatin sent to pharmacy below.    [facility] #[id number] [place] Link, [place] - [address] AT [facility] [address] [place] [address] Phone: [phone] Fax: [phone]  Pt called explaining that he requested this on Friday

## 2023-03-15 NOTE — Telephone Encounter (Signed)
Called and notified patient that medication was sent in.

## 2024-01-16 LAB — COLOGUARD: COLOGUARD: POSITIVE — AB
